# Patient Record
Sex: Female | Born: 1990 | Race: White | Hispanic: No | Marital: Single | Smoking: Current every day smoker
Health system: Southern US, Community
[De-identification: ages and names within clinical notes are randomized; demographics above are authoritative.]

## PROBLEM LIST (undated history)

## (undated) DIAGNOSIS — F32A Depression, unspecified: Secondary | ICD-10-CM

## (undated) DIAGNOSIS — F419 Anxiety disorder, unspecified: Secondary | ICD-10-CM

## (undated) DIAGNOSIS — F909 Attention-deficit hyperactivity disorder, unspecified type: Secondary | ICD-10-CM

## (undated) DIAGNOSIS — I10 Essential (primary) hypertension: Secondary | ICD-10-CM

## (undated) DIAGNOSIS — F329 Major depressive disorder, single episode, unspecified: Secondary | ICD-10-CM

## (undated) HISTORY — DX: Essential (primary) hypertension: I10

## (undated) HISTORY — DX: Depression, unspecified: F32.A

## (undated) HISTORY — DX: Attention-deficit hyperactivity disorder, unspecified type: F90.9

## (undated) HISTORY — DX: Anxiety disorder, unspecified: F41.9

## (undated) HISTORY — PX: WISDOM TOOTH EXTRACTION: SHX21

---

## 2014-03-24 ENCOUNTER — Encounter (HOSPITAL_COMMUNITY): Payer: Self-pay | Admitting: Emergency Medicine

## 2014-03-24 ENCOUNTER — Emergency Department (HOSPITAL_COMMUNITY)
Admission: EM | Admit: 2014-03-24 | Discharge: 2014-03-24 | Disposition: A | Discharge status: Home / Self Care | Payer: BLUE CROSS/BLUE SHIELD | Attending: Emergency Medicine | Admitting: Emergency Medicine

## 2014-03-24 ENCOUNTER — Emergency Department (HOSPITAL_COMMUNITY): Payer: BLUE CROSS/BLUE SHIELD

## 2014-03-24 DIAGNOSIS — R000 Tachycardia, unspecified: Secondary | ICD-10-CM | POA: Insufficient documentation

## 2014-03-24 DIAGNOSIS — F419 Anxiety disorder, unspecified: Secondary | ICD-10-CM | POA: Insufficient documentation

## 2014-03-24 DIAGNOSIS — Z720 Tobacco use: Secondary | ICD-10-CM | POA: Insufficient documentation

## 2014-03-24 DIAGNOSIS — F329 Major depressive disorder, single episode, unspecified: Secondary | ICD-10-CM | POA: Insufficient documentation

## 2014-03-24 DIAGNOSIS — R1011 Right upper quadrant pain: Secondary | ICD-10-CM | POA: Insufficient documentation

## 2014-03-24 DIAGNOSIS — R079 Chest pain, unspecified: Secondary | ICD-10-CM

## 2014-03-24 DIAGNOSIS — F909 Attention-deficit hyperactivity disorder, unspecified type: Secondary | ICD-10-CM | POA: Diagnosis not present

## 2014-03-24 DIAGNOSIS — R0789 Other chest pain: Secondary | ICD-10-CM | POA: Diagnosis not present

## 2014-03-24 DIAGNOSIS — Z79899 Other long term (current) drug therapy: Secondary | ICD-10-CM | POA: Diagnosis not present

## 2014-03-24 HISTORY — DX: Attention-deficit hyperactivity disorder, unspecified type: F90.9

## 2014-03-24 HISTORY — DX: Anxiety disorder, unspecified: F41.9

## 2014-03-24 HISTORY — DX: Depression, unspecified: F32.A

## 2014-03-24 HISTORY — DX: Major depressive disorder, single episode, unspecified: F32.9

## 2014-03-24 LAB — HEPATIC FUNCTION PANEL
ALK PHOS: 60 U/L (ref 39–117)
ALT: 12 U/L (ref 0–35)
AST: 24 U/L (ref 0–37)
Albumin: 4.5 g/dL (ref 3.5–5.2)
Bilirubin, Direct: <0.1 mg/dL (ref 0.0–0.3)
TOTAL PROTEIN: 7.7 g/dL (ref 6.0–8.3)
Total Bilirubin: 0.4 mg/dL (ref 0.3–1.2)

## 2014-03-24 LAB — BASIC METABOLIC PANEL
ANION GAP: 10 (ref 5–15)
BUN: 11 mg/dL (ref 6–23)
CHLORIDE: 108 meq/L (ref 96–112)
CO2: 22 mmol/L (ref 19–32)
CREATININE: 0.78 mg/dL (ref 0.50–1.10)
Calcium: 9.3 mg/dL (ref 8.4–10.5)
GFR calc Af Amer: >90 mL/min (ref >90)
GFR calc non Af Amer: >90 mL/min (ref >90)
GLUCOSE: 93 mg/dL (ref 70–99)
Potassium: 3.5 mmol/L (ref 3.5–5.1)
Sodium: 140 mmol/L (ref 135–145)

## 2014-03-24 LAB — I-STAT TROPONIN, ED: Troponin i, poc: 0 ng/mL (ref 0.00–0.08)

## 2014-03-24 LAB — I-STAT BETA HCG BLOOD, ED (MC, WL, AP ONLY): I-stat hCG, quantitative: <5 m[IU]/mL (ref <5)

## 2014-03-24 LAB — CBC
HCT: 41.9 % (ref 36.0–46.0)
Hemoglobin: 14.6 g/dL (ref 12.0–15.0)
MCH: 31.1 pg (ref 26.0–34.0)
MCHC: 34.8 g/dL (ref 30.0–36.0)
MCV: 89.1 fL (ref 78.0–100.0)
PLATELETS: 216 10*3/uL (ref 150–400)
RBC: 4.7 MIL/uL (ref 3.87–5.11)
RDW: 12.2 % (ref 11.5–15.5)
WBC: 6.2 10*3/uL (ref 4.0–10.5)

## 2014-03-24 LAB — RAPID URINE DRUG SCREEN, HOSP PERFORMED
AMPHETAMINES: POSITIVE — AB
BARBITURATES: NOT DETECTED
BENZODIAZEPINES: NOT DETECTED
Cocaine: NOT DETECTED
OPIATES: POSITIVE — AB
Tetrahydrocannabinol: POSITIVE — AB

## 2014-03-24 LAB — D-DIMER, QUANTITATIVE: D-Dimer, Quant: 0.4 ug/mL-FEU (ref 0.00–0.48)

## 2014-03-24 MED ORDER — LORAZEPAM 2 MG/ML IJ SOLN
1.0000 mg | Freq: Once | INTRAMUSCULAR | Status: AC
  Administered 2014-03-24: 1 mg via INTRAVENOUS
  Filled 2014-03-24: qty 1

## 2014-03-24 MED ORDER — VALACYCLOVIR HCL 500 MG PO TABS: 1000.0000 mg | ORAL_TABLET | Freq: Once | ORAL | Status: DC

## 2014-03-24 MED ORDER — SODIUM CHLORIDE 0.9 % IV BOLUS (SEPSIS)
1000.0000 mL | Freq: Once | INTRAVENOUS | Status: AC
  Administered 2014-03-24: 1000 mL via INTRAVENOUS

## 2014-03-24 MED ORDER — TRAMADOL HCL 50 MG PO TABS: 50.0000 mg | ORAL_TABLET | Freq: Four times a day (QID) | ORAL | Status: DC | PRN

## 2014-03-24 MED ORDER — MORPHINE SULFATE 4 MG/ML IJ SOLN
4.0000 mg | Freq: Once | INTRAMUSCULAR | Status: AC
  Administered 2014-03-24: 4 mg via INTRAVENOUS
  Filled 2014-03-24: qty 1

## 2014-03-24 NOTE — Discharge Instructions (Signed)
Chest Pain (Nonspecific) °It is often hard to give a specific diagnosis for the cause of chest pain. There is always a chance that your pain could be related to something serious, such as a heart attack or a blood clot in the lungs. You need to follow up with your health care provider for further evaluation. °CAUSES  °· Heartburn. °· Pneumonia or bronchitis. °· Anxiety or stress. °· Inflammation around your heart (pericarditis) or lung (pleuritis or pleurisy). °· A blood clot in the lung. °· A collapsed lung (pneumothorax). It can develop suddenly on its own (spontaneous pneumothorax) or from trauma to the chest. °· Shingles infection (herpes zoster virus). °The chest wall is composed of bones, muscles, and cartilage. Any of these can be the source of the pain. °· The bones can be bruised by injury. °· The muscles or cartilage can be strained by coughing or overwork. °· The cartilage can be affected by inflammation and become sore (costochondritis). °DIAGNOSIS  °Lab tests or other studies may be needed to find the cause of your pain. Your health care provider may have you take a test called an ambulatory electrocardiogram (ECG). An ECG records your heartbeat patterns over a 24-hour period. You may also have other tests, such as: °· Transthoracic echocardiogram (TTE). During echocardiography, sound waves are used to evaluate how blood flows through your heart. °· Transesophageal echocardiogram (TEE). °· Cardiac monitoring. This allows your health care provider to monitor your heart rate and rhythm in real time. °· Holter monitor. This is a portable device that records your heartbeat and can help diagnose heart arrhythmias. It allows your health care provider to track your heart activity for several days, if needed. °· Stress tests by exercise or by giving medicine that makes the heart beat faster. °TREATMENT  °· Treatment depends on what may be causing your chest pain. Treatment may include: °¨ Acid blockers for  heartburn. °¨ Anti-inflammatory medicine. °¨ Pain medicine for inflammatory conditions. °¨ Antibiotics if an infection is present. °· You may be advised to change lifestyle habits. This includes stopping smoking and avoiding alcohol, caffeine, and chocolate. °· You may be advised to keep your head raised (elevated) when sleeping. This reduces the chance of acid going backward from your stomach into your esophagus. °Most of the time, nonspecific chest pain will improve within 2-3 days with rest and mild pain medicine.  °HOME CARE INSTRUCTIONS  °· If antibiotics were prescribed, take them as directed. Finish them even if you start to feel better. °· For the next few days, avoid physical activities that bring on chest pain. Continue physical activities as directed. °· Do not use any tobacco products, including cigarettes, chewing tobacco, or electronic cigarettes. °· Avoid drinking alcohol. °· Only take medicine as directed by your health care provider. °· Follow your health care provider's suggestions for further testing if your chest pain does not go away. °· Keep any follow-up appointments you made. If you do not go to an appointment, you could develop lasting (chronic) problems with pain. If there is any problem keeping an appointment, call to reschedule. °SEEK MEDICAL CARE IF:  °· Your chest pain does not go away, even after treatment. °· You have a rash with blisters on your chest. °· You have a fever. °SEEK IMMEDIATE MEDICAL CARE IF:  °· You have increased chest pain or pain that spreads to your arm, neck, jaw, back, or abdomen. °· You have shortness of breath. °· You have an increasing cough, or you cough   up blood. °· You have severe back or abdominal pain. °· You feel nauseous or vomit. °· You have severe weakness. °· You faint. °· You have chills. °This is an emergency. Do not wait to see if the pain will go away. Get medical help at once. Call your local emergency services (911 in [place] Do not drive  yourself to the hospital. °MAKE SURE YOU:  °· Understand these instructions. °· Will watch your condition. °· Will get help right away if you are not doing well or get worse. °Document Released: 12/01/2004 Document Revised: 02/26/2013 Document Reviewed: 09/27/2007 °ExitCare® Patient Information ©2015 [employer]. This information is not intended to replace advice given to you by your health care provider. Make sure you discuss any questions you have with your health care provider. ° ° °Emergency Department Resource Guide °1) Find a Doctor and Pay Out of Pocket °Although you won't have to find out who is covered by your insurance plan, it is a good idea to ask around and get recommendations. You will then need to call the office and see if the doctor you have chosen will accept you as a new patient and what types of options they offer for patients who are self-pay. Some doctors offer discounts or will set up payment plans for their patients who do not have insurance, but you will need to ask so you aren't surprised when you get to your appointment. ° °2) Contact Your Local Health Department °Not all health departments have doctors that can see patients for sick visits, but many do, so it is worth a call to see if yours does. If you don't know where your local health department is, you can check in your phone book. The CDC also has a tool to help you locate your state's health department, and many state websites also have listings of all of their local health departments. ° °3) Find a Walk-in Clinic °If your illness is not likely to be very severe or complicated, you may want to try a walk in clinic. These are popping up all over the country in pharmacies, drugstores, and shopping centers. They're usually staffed by nurse practitioners or physician assistants that have been trained to treat common illnesses and complaints. They're usually fairly quick and inexpensive. However, if you have serious medical issues or  chronic medical problems, these are probably not your best option. ° °No Primary Care Doctor: °- Call [facility] at  832-8000 - they can help you locate a primary care doctor that  accepts your insurance, provides certain services, etc. °- Physician Referral Service- 1-800-533-3463 ° °Chronic Pain Problems: °Organization         Address  Phone   Notes  °[facility]  (336) 297-2271 Patients need to be referred by their primary care doctor.  ° °Medication Assistance: °Organization         Address  Phone   Notes  °[place] [facility] [address] °[place], [place] [address] (336) 641-8030 --Must be a resident of [place] °-- Must have NO insurance coverage whatsoever (no Medicaid/ Medicare, etc.) °-- The pt. MUST have a primary care doctor that directs their care regularly and follows them in the community °  °[facility]  (866) 331-1348   °[employer]  (888) 892-1162   ° °Agencies that provide inexpensive medical care: °Organization         Address  Phone   Notes  °[facility]  (  336) 832-8035   °[facility]    (336) 832-7272   °[facility] [address] °[place], [place] [address] (336) 832-4777   °[facility] [address], °[place] (336) 271-4999   °[facility]    (336) 373-0678   °[facility]    (336) 272-1050   °[facility] ° [address], [place] Phone:  (336) 832-4444, Fax:  (336) 832-4440 Hours of Operation:  9 am - 6 pm, M-F.  Also accepts Medicaid/Medicare and self-pay.  °[facility] ° [address], [place] Phone: (336) 832-3150, Fax: (336) [phone]. Hours of Operation:  8:30 am - 5:30 pm, M-F.  Also accepts Medicaid and self-pay.  °[facility] [place] [address], [place] Phone: (336) 878-6027   °[facility] [address], [place], [place]  (336)723-1848, Ext. 123 Mondays & Thursdays: 7-9 AM.  First 15 patients are seen on a first come, first serve basis. °  ° °Medicaid-accepting [place] Providers: ° °Organization         Address  Phone   Notes  °[facility] [address], [place] (336) 641-2100 Also accepts self-pay patients.  °[facility] [address], [place] ° (336) 856-9996   °[facility] [address], [place] (336) 288-8857   °[facility] [address] [place] [address], [place] (336) 299-7000   °Veita Bland [address], [place]  ° (336) 373-1557 Only accepts [place] Access Medicaid patients after they have their name applied to their card.  ° °Self-Pay (no insurance) in [place]: ° °Organization         Address  Phone   Notes  °Sickle Cell Patients, [facility] [address], [place] (336) 832-1970   °[facility] [address], [place] (336) 832-4400   °[facility] ° [address],  (336) 992-4800   °[facility]/Dr. Osei-Bonsu ° [address] [place] [address], [place] or [address], [place] (336) 841-8500 Phone number for both [place] and [place] locations is the same.  °[facility] [address], [place] (336) 299-0000   °[facility] [address] [place] [address], [place] or [address] (336) 852-7530 °(336) 878-2260   °[facility] [address], [place] (336) 350-1642, phone; (336) 294-5005, fax Sees patients 1st and 3rd Saturday of every month.  Must not qualify for public or private insurance (i.e. Medicaid, Medicare, [place] Health Choice, Veterans' Benefits) • Household income should be no more than 200% of the poverty level •The clinic cannot treat you if you are pregnant or think you are pregnant • Sexually transmitted  diseases are not treated at the clinic.  ° ° °Dental Care: °Organization         Address  Phone  Notes  °[facility] [address],  (336) 641-6152 Accepts children up to age 21 who are enrolled in Medicaid or [place] Health Choice; pregnant women with a Medicaid card; and children who have applied for Medicaid or  Health Choice, but were declined, whose parents can pay a reduced fee at time of service.  °[facility] [place]    [address], [place] (336) 641-7733 Accepts children up to age 21 who are enrolled in Medicaid or [place] Health Choice; pregnant women with a Medicaid card; and children who have applied for Medicaid or [place] Health Choice, but were declined, whose parents can pay a reduced fee at time of service.  °[facility] ° [address], [place] (336) 641-4533 Patients are seen by appointment only. Walk-ins are not accepted. [facility] will see patients 18 years of age and older. °Monday - Tuesday (8am-5pm) °Most Wednesdays (8:30-5pm) °$30 per visit, cash only  °[facility] ° [address], [place] (336) 641-4533 Patients are seen by appointment only. Walk-ins are not accepted. [facility] will see patients 18 years of age and older. °One Wednesday Evening (Monthly: Volunteer Based).  $30 per visit, cash only  °[facility]  (919) 537-3737 for adults; Children under age 4, call Graduate Pediatric Dentistry at ([phone]) [phone]. Children aged 4-14, please call ([phone]) [phone] to request a pediatric application. ° Dental services are provided in all areas of dental care including fillings, crowns and bridges, complete and partial dentures, implants, gum treatment, root canals, and extractions. Preventive care is also provided. Treatment is provided to both adults and children. °Patients are selected via a  lottery and there is often a waiting list. °  °[facility] [address], °[place] ° (336) 763-8833 [email or web address] °  °[facility] [address], [place], [place] (336)723-1848, Ext. 123 Second and Fourth Thursday of each month, opens at 6:30 AM; Clinic ends at 9 AM.  Patients are seen on a first-come first-served basis, and a limited number are seen during each clinic.  ° °[facility] ° [address], [place], [place] (336) 723-7904   Eligibility Requirements °You must have lived in [place], [place], or [place] counties for at least the last three months. °  You cannot be eligible for state or federal sponsored healthcare insurance, including Veterans Administration, Medicaid, or Medicare. °  You generally cannot be eligible for healthcare insurance through your employer.  °  How to apply: °Eligibility screenings are held every Tuesday and Wednesday afternoon from 1:00 pm until 4:00 pm. You do not need an appointment for the interview!  °[facility] [address], [place], [place] 336-631-2330   °[facility]  336-342-8273   °[facility]  336-703-3100   °[facility]  336-570-6415   ° °Behavioral Health Resources in the Community: °Intensive Outpatient Programs °Organization         Address  Phone  Notes  °[facility] [address], [place], [place] 336-878-6098   °[facility] [address], [place], [place] 336-832-9800   °[facility] [address], [place], [place] ° 336-882-2125   °[facility] [address],  °[place], [place] 1-800-853-5163 or 336-641-4981   °Substance Abuse Resources °Organization         Address  Phone  Notes  °[facility]  336-882-2125   °[facility]  336-784-9470   °[facility]  336-285-9073   °[facility]  336-845-3988   °Residential &  Outpatient Substance Abuse Program  1-800-659-3381   °Psychological Services °Organization         Address  Phone  Notes  °[facility]  336- 832-9600   °  [facility]  336- 378-7881   °[facility] [address], [place] 1-800-853-5163 or 336-641-4981   ° °Mobile Crisis Teams °Organization         Address  Phone  Notes  °[facility], Mobile Crisis Care Unit  1-877-626-1772   °Assertive °[facility] ° [address] [place], [place] 336-834-9664   °Sharon DeEsch [address] °[place] 336-554-5454   ° °Self-Help/Support Groups °Organization         Address  Phone             Notes  °[facility] - variety of support groups  336- 373-1402 Call for more information  °Narcotics Anonymous (NA), [facility] [address], °[place]  2 meetings at this location  ° °Residential Treatment Programs °Organization         Address  Phone  Notes  °[facility] [address],    °[place]  1-866-801-8205   °[facility] ° [address], [place], [place] 704-293-8524   °[facility] [address], [place] 336-845-3988 Admissions: 8am-3pm M-F  °[facility] [address]    °[place], [place] 336-841-1104   °[facility] [address], [place], [place] 336-379-7146   °[facility] [address]  °[place], [place] 336-285-9073   °[facility] Intensive Outpatient [address], [place], [place] 336-852-3033   °[facility] [address]  °[place], [place] 1-877-615-2722 or 336-784-9470   °[facility] ([facility] [address] [place], [place] 336-227-7417 Accepts Medicaid  °[facility] [address]  °[place] 1-800-659-3381 Substance Abuse/Addiction Treatment  ° °[place] Behavioral Health Resources °Organization          Address  Phone  Notes  °[facility]  (888) 581-9988   °Julie Brannon, PhD [address] [place], [place]   (336) 349-5553 or (336) 951-0000   ° Behavioral   [address] °[place], [place] (336) 349-4454   °[facility] [address], [place], [place] (336) 342-8316 Insurance/Medicaid/sponsorship through [facility]  °[facility] [address]                                    [place], [place] (336) 342-8316 Therapy/tele-psych/case  °[facility] [address]  ° [place], [place] (336) 349-2233    °Dr. Arfeen  (336) 349-4544   °[facility] [place]  [employer] [place] [facility] 1) [address], [place] °2) [address], [place] °3)  [address], [place] (336) 349-3220 °(336) 342-7768 ° °(336) 342-8140   °[place] [facility] (336) 342-1394 or (336) 342-3537 (After Hours)    ° ° ° °

## 2014-03-24 NOTE — ED Notes (Signed)
PA student at bedside, unable to obtain blood at this time.

## 2014-03-24 NOTE — ED Notes (Signed)
Pt c/o sharp pain in epigastric area x couple years, pt states it has gotten to a point where its an everyday problem. Pt denies n/v or SOB.

## 2014-03-24 NOTE — ED Notes (Signed)
PA at bedside, unable to obtain blood labs at this time.

## 2014-03-24 NOTE — ED Provider Notes (Signed)
2211 - [facility] at shift change. Plan discussed with Pisciotta, PA-C which includes discharging ultrasound negative. Ultrasound reviewed which is negative for any acute findings. No evidence of cholelithiasis or cholecystitis. UDS also reviewed. Suspect that positive amphetamines is secondary to prescribed Adderall. Patient does endorse recreational marijuana use. Opiates on UDS likely secondary to morphine given in ED. I have reviewed the findings with the patient in their entirety. She expresses frustration with the inability to find what is wrong with her causing her pain. I have reassured the patient that it does not appear to be emergent in nature and that she may safely follow up with a primary care provider on an outpatient basis. Patient given resource guide as well as return precautions. Patient agreeable to plan with no unaddressed concerns. Patient discharged in good condition; VSS.  Results for orders placed or performed during the hospital encounter of 03/24/14  CBC  Result Value Ref Range   WBC 6.2 4.0 - 10.5 K/uL   RBC 4.70 3.87 - 5.11 MIL/uL   Hemoglobin 14.6 12.0 - 15.0 g/dL   HCT 16.141.9 09.636.0 - 04.546.0 %   MCV 89.1 78.0 - 100.0 fL   MCH 31.1 26.0 - 34.0 pg   MCHC 34.8 30.0 - 36.0 g/dL   RDW 40.912.2 81.111.5 - 91.415.5 %   Platelets 216 150 - 400 K/uL  Basic metabolic panel  Result Value Ref Range   Sodium 140 135 - 145 mmol/L   Potassium 3.5 3.5 - 5.1 mmol/L   Chloride 108 96 - 112 mEq/L   CO2 22 19 - 32 mmol/L   Glucose, Bld 93 70 - 99 mg/dL   BUN 11 6 - 23 mg/dL   Creatinine, Ser 7.820.78 0.50 - 1.10 mg/dL   Calcium 9.3 8.4 - 95.610.5 mg/dL   GFR calc non Af Amer >90 >90 mL/min   GFR calc Af Amer >90 >90 mL/min   Anion gap 10 5 - 15  D-dimer, quantitative  Result Value Ref Range   D-Dimer, Quant 0.40 0.00 - 0.48 ug/mL-FEU  Hepatic function panel  Result Value Ref Range   Total Protein 7.7 6.0 - 8.3 g/dL   Albumin 4.5 3.5 - 5.2 g/dL   AST 24 0 - 37  U/L   ALT 12 0 - 35 U/L   Alkaline Phosphatase 60 39 - 117 U/L   Total Bilirubin 0.4 0.3 - 1.2 mg/dL   Bilirubin, Direct <2.1<0.1 0.0 - 0.3 mg/dL   Indirect Bilirubin NOT CALCULATED 0.3 - 0.9 mg/dL  Drug screen panel, emergency  Result Value Ref Range   Opiates POSITIVE (A) NONE DETECTED   Cocaine NONE DETECTED NONE DETECTED   Benzodiazepines NONE DETECTED NONE DETECTED   Amphetamines POSITIVE (A) NONE DETECTED   Tetrahydrocannabinol POSITIVE (A) NONE DETECTED   Barbiturates NONE DETECTED NONE DETECTED  I-stat troponin, ED ([facility])  Result Value Ref Range   Troponin i, poc 0.00 0.00 - 0.08 ng/mL   Comment 3          I-Stat Beta hCG blood, ED ([facility], [facility], [facility] only)  Result Value Ref Range   I-stat hCG, quantitative <5.0 <5 mIU/mL   Comment 3           Dg Chest 2 View  03/24/2014   CLINICAL DATA:  Sharp pain in the epigastric area for a couple years. Has gotten worse.  EXAM: CHEST  2 VIEW  COMPARISON:  None.  FINDINGS: The heart size and  mediastinal contours are within normal limits. Both lungs are clear. The visualized skeletal structures are unremarkable.  IMPRESSION: No active cardiopulmonary disease.   Electronically Signed   By: Elige Ko   On: 03/24/2014 18:30   US Abdomen Complete  03/24/2014   CLINICAL DATA:  Right upper quadrant pain for 2 years, now worse.  EXAM: ULTRASOUND ABDOMEN COMPLETE  COMPARISON:  None.  FINDINGS: Gallbladder: No gallstones or wall thickening visualized. No sonographic Murphy sign noted.  Common bile duct: Diameter: 3.5 mm  Liver: No focal lesion identified. Within normal limits in parenchymal echogenicity.  IVC: No abnormality visualized.  Pancreas: Visualized portion unremarkable.  Spleen: Size and appearance within normal limits.  Right Kidney: Length: 9.9 cm. Echogenicity within normal limits. No mass or hydronephrosis visualized.  Left Kidney: Length: 10.5 cm. Echogenicity within normal limits. No mass or hydronephrosis visualized.  Abdominal aorta: No  aneurysm visualized.  Other findings: None.  IMPRESSION: Normal abdominal ultrasound.   Electronically Signed   By: Amie Portland M.D.   On: 03/24/2014 20:41      Antony Madura, PA-C 03/25/14 1478  Flint Melter, MD 03/25/14 [phone]

## 2014-03-24 NOTE — ED Provider Notes (Signed)
CSN: [id number]     Arrival date & time 03/24/14  1710 History   First MD Initiated Contact with Patient 03/24/14 1737     Chief Complaint  Patient presents with  . Chest Pain     (Consider location/radiation/quality/duration/timing/severity/associated sxs/prior Treatment) HPI  Sharion Settlerlla Cuadrado is a 24 y.o. female complaining of presents to ED today c/o R rib pain for "several years." She states she has been ignoring this pain and putting off having it been evaluated. She states it worsened on Saturday AM during a coughing fit from what she describes as a resolving cold. Since that time she has had R rib/RUQ pain that is worsened by movement, cough, and palpation. The pain is relieved with position and rest. She describes the pain as sharp and stabbing. The pain does radiate and is 10/10 at worst. The pain is not associated with eating. She denies NVD, DOE, leg swelling/pain, and CP. She has no recent extended travel, but does endorse OCPs and cigarette smoking.  Past Medical History  Diagnosis Date  . Anxiety   . ADHD (attention deficit hyperactivity disorder)   . Depression    Past Surgical History  Procedure Laterality Date  . Wisdom tooth extraction     No family history on file. History  Substance Use Topics  . Smoking status: Current Every Day Smoker  . Smokeless tobacco: Not on file  . Alcohol Use: Yes   OB History    No data available     Review of Systems  10 systems reviewed and found to be negative, except as noted in the HPI.  Allergies  Review of patient's allergies indicates no known allergies.  Home Medications   Prior to Admission medications   Medication Sig Start Date End Date Taking? Authorizing Provider  amphetamine-dextroamphetamine (ADDERALL XR) 20 MG 24 hr capsule Take 1 capsule by mouth 2 (two) times daily. 03/12/14  Yes Historical Provider, MD  APRI 0.15-30 MG-MCG tablet Take 1 tablet by mouth daily. 03/17/14  Yes Historical Provider, MD  busPIRone  (BUSPAR) 10 MG tablet Take 1 tablet by mouth 3 (three) times daily as needed. anxiety 03/12/14  Yes Historical Provider, MD  clonazePAM (KLONOPIN) 0.5 MG tablet Take 1 tablet by mouth 2 (two) times daily as needed. anxiety 03/12/14  Yes Historical Provider, MD  hydrOXYzine (VISTARIL) 25 MG capsule Take 1 capsule by mouth at bedtime as needed. sleep 03/12/14  Yes Historical Provider, MD  venlafaxine XR (EFFEXOR-XR) 75 MG 24 hr capsule Take 1 capsule by mouth 2 (two) times daily. 03/12/14  Yes Historical Provider, MD   BP 132/94 mmHg  Pulse 142  Temp(Src) 98.3 F (36.8 C) (Oral)  Resp 22  SpO2 100%  LMP 03/03/2014 (Exact Date) Physical Exam  Constitutional: She is oriented to person, place, and time. She appears well-developed and well-nourished. No distress.  HENT:  Head: Normocephalic.  Mouth/Throat: Oropharynx is clear and moist.  Eyes: Conjunctivae and EOM are normal. Pupils are equal, round, and reactive to light.  Neck: Normal range of motion. Neck supple.  Cardiovascular: Regular rhythm and intact distal pulses.   Tachycardic  Pulmonary/Chest: Effort normal and breath sounds normal. No stridor. No respiratory distress. She has no wheezes. She has no rales. She exhibits tenderness.  Patient starts crying and splints before I am able to palpate her abdomen or chest. Ports exquisite tenderness to palpation of left right anterior lower chest and right upper quadrant.  Abdominal: Bowel sounds are normal. She exhibits no distension and no  mass. There is tenderness. There is no rebound and no guarding.  Musculoskeletal: Normal range of motion. She exhibits no edema or tenderness.  No calf asymmetry, superficial collaterals, palpable cords, edema, Homans sign negative bilaterally.    Neurological: She is alert and oriented to person, place, and time.  Psychiatric: She has a normal mood and affect.  Tearful, anxious.  Nursing note and vitals reviewed.   ED Course  Procedures (including critical  care time) Labs Review Labs Reviewed  CBC  BASIC METABOLIC PANEL  D-DIMER, QUANTITATIVE  HEPATIC FUNCTION PANEL  URINE RAPID DRUG SCREEN (HOSP PERFORMED)  I-STAT TROPOININ, ED  I-STAT BETA HCG BLOOD, ED ([facility], [facility], [facility] ONLY)    Imaging Review No results found.   EKG Interpretation None      MDM   Final diagnoses:  Left sided chest pain  RUQ abdominal pain    Filed Vitals:   03/24/14 1856 03/24/14 1857 03/24/14 1858 03/24/14 1859  BP:      Pulse: 112 111 110 115  Temp:      TempSrc:      Resp: SpO2: 100% 100% 100% 100%    Medications  sodium chloride 0.9 % bolus 1,000 mL (1,000 mLs Intravenous New Bag/Given 03/24/14 1840)  morphine 4 MG/ML injection 4 mg (4 mg Intravenous Given 03/24/14 1840)  LORazepam (ATIVAN) injection 1 mg (1 mg Intravenous Given 03/24/14 1841)    Amiley Shishido is a pleasant 24 y.o. female presenting with exacerbation of patient's chronic right upper quadrant pain, states that this pain is more severe than normal. Patient is extraordinarily anxious, tearful. She has good insight and knows that she is having an anxiety attack. Physical exam is not consistent with DVT, however patient does take hormonal birth control so d-dimer is ordered. EKG so sinus tachycardia, chest x-rays without infiltrate, no anemia or other blood abnormality.  Patient's d-dimer is negative, will obtain abdominal ultrasound to further investigate the cause of patient's chronic pain. I've advised the patient of her normal results, I have reassured her and after Ativan she is much calmer and her tachycardia has improved but not completely resolved. Patient will be given fluid bolus. Case signed out to PA [facility] at shift change: Plan is to follow-up ultrasound and reassess.    Wynetta Emery, PA-C 03/24/14 2025  Flint Melter, MD 03/25/14 0000

## 2014-06-08 ENCOUNTER — Emergency Department (HOSPITAL_COMMUNITY)
Admission: EM | Admit: 2014-06-08 | Discharge: 2014-06-08 | Disposition: A | Discharge status: Home / Self Care | Payer: BLUE CROSS/BLUE SHIELD | Attending: Emergency Medicine | Admitting: Emergency Medicine

## 2014-06-08 ENCOUNTER — Encounter (HOSPITAL_COMMUNITY): Payer: Self-pay | Admitting: *Deleted

## 2014-06-08 ENCOUNTER — Inpatient Hospital Stay (HOSPITAL_COMMUNITY)
Admission: AD | Admit: 2014-06-08 | Discharge: 2014-06-13 | DRG: 897 | Disposition: A | Discharge status: Home / Self Care | Payer: BLUE CROSS/BLUE SHIELD | Source: Intra-hospital | Attending: Psychiatry | Admitting: Psychiatry

## 2014-06-08 ENCOUNTER — Encounter (HOSPITAL_COMMUNITY): Payer: Self-pay | Admitting: Emergency Medicine

## 2014-06-08 DIAGNOSIS — F329 Major depressive disorder, single episode, unspecified: Secondary | ICD-10-CM | POA: Insufficient documentation

## 2014-06-08 DIAGNOSIS — J029 Acute pharyngitis, unspecified: Secondary | ICD-10-CM | POA: Diagnosis present

## 2014-06-08 DIAGNOSIS — F410 Panic disorder [episodic paroxysmal anxiety] without agoraphobia: Secondary | ICD-10-CM | POA: Diagnosis present

## 2014-06-08 DIAGNOSIS — R45851 Suicidal ideations: Secondary | ICD-10-CM | POA: Diagnosis present

## 2014-06-08 DIAGNOSIS — F1920 Other psychoactive substance dependence, uncomplicated: Secondary | ICD-10-CM

## 2014-06-08 DIAGNOSIS — F419 Anxiety disorder, unspecified: Secondary | ICD-10-CM | POA: Insufficient documentation

## 2014-06-08 DIAGNOSIS — Z3202 Encounter for pregnancy test, result negative: Secondary | ICD-10-CM | POA: Insufficient documentation

## 2014-06-08 DIAGNOSIS — F909 Attention-deficit hyperactivity disorder, unspecified type: Secondary | ICD-10-CM | POA: Diagnosis present

## 2014-06-08 DIAGNOSIS — F17210 Nicotine dependence, cigarettes, uncomplicated: Secondary | ICD-10-CM | POA: Diagnosis present

## 2014-06-08 DIAGNOSIS — B9689 Other specified bacterial agents as the cause of diseases classified elsewhere: Secondary | ICD-10-CM

## 2014-06-08 DIAGNOSIS — F1994 Other psychoactive substance use, unspecified with psychoactive substance-induced mood disorder: Secondary | ICD-10-CM | POA: Diagnosis not present

## 2014-06-08 DIAGNOSIS — Z720 Tobacco use: Secondary | ICD-10-CM | POA: Diagnosis not present

## 2014-06-08 DIAGNOSIS — J208 Acute bronchitis due to other specified organisms: Secondary | ICD-10-CM

## 2014-06-08 DIAGNOSIS — Z79899 Other long term (current) drug therapy: Secondary | ICD-10-CM | POA: Diagnosis not present

## 2014-06-08 DIAGNOSIS — F32A Depression, unspecified: Secondary | ICD-10-CM

## 2014-06-08 DIAGNOSIS — F322 Major depressive disorder, single episode, severe without psychotic features: Secondary | ICD-10-CM | POA: Diagnosis not present

## 2014-06-08 LAB — CBC WITH DIFFERENTIAL/PLATELET
BASOS PCT: 0 % (ref 0–1)
Basophils Absolute: 0 10*3/uL (ref 0.0–0.1)
EOS PCT: 0 % (ref 0–5)
Eosinophils Absolute: 0.1 10*3/uL (ref 0.0–0.7)
HCT: 35.8 % — ABNORMAL LOW (ref 36.0–46.0)
Hemoglobin: 11.6 g/dL — ABNORMAL LOW (ref 12.0–15.0)
Lymphocytes Relative: 8 % — ABNORMAL LOW (ref 12–46)
Lymphs Abs: 1.7 10*3/uL (ref 0.7–4.0)
MCH: 29.9 pg (ref 26.0–34.0)
MCHC: 32.4 g/dL (ref 30.0–36.0)
MCV: 92.3 fL (ref 78.0–100.0)
MONO ABS: 2.5 10*3/uL — AB (ref 0.1–1.0)
Monocytes Relative: 12 % (ref 3–12)
Neutro Abs: 16.3 10*3/uL — ABNORMAL HIGH (ref 1.7–7.7)
Neutrophils Relative %: 80 % — ABNORMAL HIGH (ref 43–77)
PLATELETS: 241 10*3/uL (ref 150–400)
RBC: 3.88 MIL/uL (ref 3.87–5.11)
RDW: 13.1 % (ref 11.5–15.5)
WBC: 20.5 10*3/uL — ABNORMAL HIGH (ref 4.0–10.5)

## 2014-06-08 LAB — COMPREHENSIVE METABOLIC PANEL
ALK PHOS: 53 U/L (ref 39–117)
ALT: 13 U/L (ref 0–35)
AST: 21 U/L (ref 0–37)
Albumin: 4 g/dL (ref 3.5–5.2)
Anion gap: 10 (ref 5–15)
BILIRUBIN TOTAL: 0.5 mg/dL (ref 0.3–1.2)
BUN: 9 mg/dL (ref 6–23)
CALCIUM: 8.8 mg/dL (ref 8.4–10.5)
CO2: 20 mmol/L (ref 19–32)
Chloride: 106 mmol/L (ref 96–112)
Creatinine, Ser: 0.68 mg/dL (ref 0.50–1.10)
GFR calc Af Amer: >90 mL/min (ref >90)
GFR calc non Af Amer: >90 mL/min (ref >90)
Glucose, Bld: 103 mg/dL — ABNORMAL HIGH (ref 70–99)
Potassium: 3.8 mmol/L (ref 3.5–5.1)
SODIUM: 136 mmol/L (ref 135–145)
Total Protein: 7 g/dL (ref 6.0–8.3)

## 2014-06-08 LAB — RAPID URINE DRUG SCREEN, HOSP PERFORMED
AMPHETAMINES: NOT DETECTED
BARBITURATES: NOT DETECTED
Benzodiazepines: NOT DETECTED
Cocaine: NOT DETECTED
Opiates: POSITIVE — AB
TETRAHYDROCANNABINOL: POSITIVE — AB

## 2014-06-08 LAB — ETHANOL: Alcohol, Ethyl (B): <5 mg/dL (ref 0–9)

## 2014-06-08 LAB — POC URINE PREG, ED: Preg Test, Ur: NEGATIVE

## 2014-06-08 LAB — MONONUCLEOSIS SCREEN: MONO SCREEN: NEGATIVE

## 2014-06-08 MED ORDER — MORPHINE SULFATE 4 MG/ML IJ SOLN
4.0000 mg | Freq: Once | INTRAMUSCULAR | Status: AC
  Administered 2014-06-08: 4 mg via INTRAVENOUS
  Filled 2014-06-08: qty 1

## 2014-06-08 MED ORDER — ACETAMINOPHEN 325 MG PO TABS
650.0000 mg | ORAL_TABLET | Freq: Four times a day (QID) | ORAL | Status: DC | PRN
  Administered 2014-06-08: 650 mg via ORAL
  Filled 2014-06-08: qty 2

## 2014-06-08 MED ORDER — SODIUM CHLORIDE 0.9 % IV BOLUS (SEPSIS)
1000.0000 mL | Freq: Once | INTRAVENOUS | Status: AC
  Administered 2014-06-08: 1000 mL via INTRAVENOUS

## 2014-06-08 MED ORDER — ALUM & MAG HYDROXIDE-SIMETH 200-200-20 MG/5ML PO SUSP: 30.0000 mL | ORAL | Status: DC | PRN

## 2014-06-08 MED ORDER — ADULT MULTIVITAMIN W/MINERALS CH
1.0000 | ORAL_TABLET | Freq: Every day | ORAL | Status: DC
  Administered 2014-06-08 – 2014-06-13 (×6): 1 via ORAL
  Filled 2014-06-08: qty 1
  Filled 2014-06-08: qty 3
  Filled 2014-06-08 (×8): qty 1

## 2014-06-08 MED ORDER — CHLORDIAZEPOXIDE HCL 25 MG PO CAPS
25.0000 mg | ORAL_CAPSULE | Freq: Three times a day (TID) | ORAL | Status: AC
  Administered 2014-06-10 (×2): 25 mg via ORAL
  Filled 2014-06-08 (×3): qty 1

## 2014-06-08 MED ORDER — BUSPIRONE HCL 10 MG PO TABS: 10.0000 mg | ORAL_TABLET | Freq: Three times a day (TID) | ORAL | Status: DC | PRN

## 2014-06-08 MED ORDER — CHLORDIAZEPOXIDE HCL 25 MG PO CAPS
25.0000 mg | ORAL_CAPSULE | Freq: Every day | ORAL | Status: AC
  Filled 2014-06-08 (×3): qty 1

## 2014-06-08 MED ORDER — DESOGESTREL-ETHINYL ESTRADIOL 0.15-30 MG-MCG PO TABS: 1.0000 | ORAL_TABLET | Freq: Every day | ORAL | Status: DC

## 2014-06-08 MED ORDER — THIAMINE HCL 100 MG/ML IJ SOLN: 100.0000 mg | Freq: Once | INTRAMUSCULAR | Status: DC

## 2014-06-08 MED ORDER — LOPERAMIDE HCL 2 MG PO CAPS: 2.0000 mg | ORAL_CAPSULE | ORAL | Status: AC | PRN

## 2014-06-08 MED ORDER — ACETAMINOPHEN 325 MG PO TABS: 650.0000 mg | ORAL_TABLET | ORAL | Status: DC | PRN

## 2014-06-08 MED ORDER — LORAZEPAM 1 MG PO TABS: 1.0000 mg | ORAL_TABLET | Freq: Three times a day (TID) | ORAL | Status: DC | PRN

## 2014-06-08 MED ORDER — CHLORDIAZEPOXIDE HCL 25 MG PO CAPS
25.0000 mg | ORAL_CAPSULE | Freq: Four times a day (QID) | ORAL | Status: AC | PRN
  Administered 2014-06-11: 25 mg via ORAL
  Filled 2014-06-08: qty 1

## 2014-06-08 MED ORDER — CLONIDINE HCL 0.1 MG PO TABS: 0.1000 mg | ORAL_TABLET | Freq: Every day | ORAL | Status: DC

## 2014-06-08 MED ORDER — MAGNESIUM HYDROXIDE 400 MG/5ML PO SUSP: 30.0000 mL | Freq: Every day | ORAL | Status: DC | PRN

## 2014-06-08 MED ORDER — LIDOCAINE VISCOUS 2 % MT SOLN
20.0000 mL | OROMUCOSAL | Status: DC | PRN
  Filled 2014-06-08: qty 30

## 2014-06-08 MED ORDER — METHOCARBAMOL 500 MG PO TABS
500.0000 mg | ORAL_TABLET | Freq: Three times a day (TID) | ORAL | Status: DC | PRN
Start: 2014-06-08 — End: 2014-06-13
  Administered 2014-06-11: 500 mg via ORAL
  Filled 2014-06-08: qty 1

## 2014-06-08 MED ORDER — VENLAFAXINE HCL ER 75 MG PO CP24
75.0000 mg | ORAL_CAPSULE | Freq: Two times a day (BID) | ORAL | Status: DC
  Filled 2014-06-08: qty 1

## 2014-06-08 MED ORDER — MAGIC MOUTHWASH
10.0000 mL | Freq: Once | ORAL | Status: AC
  Administered 2014-06-08: 10 mL via ORAL
  Filled 2014-06-08: qty 10

## 2014-06-08 MED ORDER — TRAMADOL HCL 50 MG PO TABS: 50.0000 mg | ORAL_TABLET | Freq: Four times a day (QID) | ORAL | Status: DC | PRN

## 2014-06-08 MED ORDER — CLONIDINE HCL 0.1 MG PO TABS
0.1000 mg | ORAL_TABLET | Freq: Four times a day (QID) | ORAL | Status: DC
  Administered 2014-06-08 – 2014-06-09 (×3): 0.1 mg via ORAL
  Filled 2014-06-08 (×13): qty 1

## 2014-06-08 MED ORDER — KETOROLAC TROMETHAMINE 30 MG/ML IJ SOLN
30.0000 mg | Freq: Once | INTRAMUSCULAR | Status: AC
  Administered 2014-06-08: 30 mg via INTRAVENOUS
  Filled 2014-06-08: qty 1

## 2014-06-08 MED ORDER — DICYCLOMINE HCL 20 MG PO TABS: 20.0000 mg | ORAL_TABLET | Freq: Four times a day (QID) | ORAL | Status: DC | PRN

## 2014-06-08 MED ORDER — IBUPROFEN 200 MG PO TABS: 600.0000 mg | ORAL_TABLET | Freq: Three times a day (TID) | ORAL | Status: DC | PRN

## 2014-06-08 MED ORDER — NICOTINE 21 MG/24HR TD PT24: 21.0000 mg | MEDICATED_PATCH | Freq: Every day | TRANSDERMAL | Status: DC

## 2014-06-08 MED ORDER — CHLORDIAZEPOXIDE HCL 25 MG PO CAPS
25.0000 mg | ORAL_CAPSULE | ORAL | Status: AC
  Administered 2014-06-10 – 2014-06-11 (×2): 25 mg via ORAL
  Filled 2014-06-08: qty 1

## 2014-06-08 MED ORDER — AMPHETAMINE-DEXTROAMPHET ER 10 MG PO CP24: 20.0000 mg | ORAL_CAPSULE | Freq: Two times a day (BID) | ORAL | Status: DC

## 2014-06-08 MED ORDER — NAPROXEN 500 MG PO TABS
500.0000 mg | ORAL_TABLET | Freq: Two times a day (BID) | ORAL | Status: DC | PRN
  Administered 2014-06-09 – 2014-06-12 (×7): 500 mg via ORAL
  Filled 2014-06-08 (×8): qty 1

## 2014-06-08 MED ORDER — VITAMIN B-1 100 MG PO TABS
100.0000 mg | ORAL_TABLET | Freq: Every day | ORAL | Status: DC
  Administered 2014-06-09 – 2014-06-13 (×5): 100 mg via ORAL
  Filled 2014-06-08 (×8): qty 1

## 2014-06-08 MED ORDER — HYDROXYZINE HCL 25 MG PO TABS
25.0000 mg | ORAL_TABLET | Freq: Four times a day (QID) | ORAL | Status: DC | PRN
  Administered 2014-06-08 – 2014-06-09 (×2): 25 mg via ORAL
  Filled 2014-06-08 (×2): qty 1

## 2014-06-08 MED ORDER — ONDANSETRON 4 MG PO TBDP: 4.0000 mg | ORAL_TABLET | Freq: Four times a day (QID) | ORAL | Status: AC | PRN

## 2014-06-08 MED ORDER — CLONIDINE HCL 0.1 MG PO TABS
0.1000 mg | ORAL_TABLET | ORAL | Status: DC
  Filled 2014-06-08: qty 1

## 2014-06-08 MED ORDER — ONDANSETRON HCL 4 MG PO TABS: 4.0000 mg | ORAL_TABLET | Freq: Three times a day (TID) | ORAL | Status: DC | PRN

## 2014-06-08 MED ORDER — CHLORDIAZEPOXIDE HCL 25 MG PO CAPS
25.0000 mg | ORAL_CAPSULE | Freq: Four times a day (QID) | ORAL | Status: AC
  Administered 2014-06-08 – 2014-06-09 (×4): 25 mg via ORAL
  Filled 2014-06-08 (×4): qty 1

## 2014-06-08 NOTE — ED Notes (Addendum)
Pt from home c/o sore throat x 2 days. Pt reports that is difficult to eat or drink. Denies fever. She reports being seen at [facility] testing negative for strep.  Appears to be possible peristonsilar abcess. Right side of throat red and swollen however, not obstructing airway. Pt tearful during assessment.

## 2014-06-08 NOTE — ED Provider Notes (Signed)
CSN: [id number]     Arrival date & time 06/08/14  1050 History   First MD Initiated Contact with Patient 06/08/14 1110     Chief Complaint  Patient presents with  . Sore Throat     (Consider location/radiation/quality/duration/timing/severity/associated sxs/prior Treatment) HPI   24 year old female with history of anxiety and depression who presents complaining of sore throat. Patient developed gradual onset of throat irritation and tenderness to her throat ongoing for the past 3 days. She also endorses nasal congestion, occasional cough. She reported having difficulty eating and drinking due to the pain. Denies having any fever, chest pain, shortness of breath, abdominal pain, vomiting or diarrhea, or rash. Report history of strep throat in the past. States she was seen at [facility] yesterday for this complaint. States she had a negative strep test and was discharged with lidocaine mouth rinse and ibuprofen. States she has been taking the prescribed medication but symptoms got much worse. She is here requesting for further evaluation.  Past Medical History  Diagnosis Date  . Anxiety   . ADHD (attention deficit hyperactivity disorder)   . Depression    Past Surgical History  Procedure Laterality Date  . Wisdom tooth extraction     No family history on file. History  Substance Use Topics  . Smoking status: Current Every Day Smoker  . Smokeless tobacco: Not on file  . Alcohol Use: Yes   OB History    No data available     Review of Systems  All other systems reviewed and are negative.     Allergies  Lavender oil  Home Medications   Prior to Admission medications   Medication Sig Start Date End Date Taking? Authorizing Provider  amphetamine-dextroamphetamine (ADDERALL XR) 20 MG 24 hr capsule Take 1 capsule by mouth 2 (two) times daily. 03/12/14  Yes Historical Provider, MD  APRI 0.15-30 MG-MCG tablet Take 1 tablet by mouth daily. 03/17/14  Yes Historical Provider, MD   busPIRone (BUSPAR) 10 MG tablet Take 1 tablet by mouth 3 (three) times daily as needed. anxiety 03/12/14  Yes Historical Provider, MD  clonazePAM (KLONOPIN) 0.5 MG tablet Take 1 tablet by mouth 2 (two) times daily as needed. anxiety 03/12/14  Yes Historical Provider, MD  ibuprofen (ADVIL,MOTRIN) 200 MG tablet Take 400 mg by mouth every 6 (six) hours as needed for fever or moderate pain.   Yes Historical Provider, MD  lidocaine (XYLOCAINE) 2 % solution Use as directed 20 mLs in the mouth or throat every 4 (four) hours as needed for mouth pain.  06/07/14  Yes Historical Provider, MD  venlafaxine XR (EFFEXOR-XR) 75 MG 24 hr capsule Take 1 capsule by mouth 2 (two) times daily. 03/12/14  Yes Historical Provider, MD  traMADol (ULTRAM) 50 MG tablet Take 1 tablet (50 mg total) by mouth every 6 (six) hours as needed for severe pain. Patient not taking: Reported on 06/08/2014 03/24/14   Antony MaduraKelly Humes, PA-C   BP 113/80 mmHg  Pulse 104  Temp(Src) 98.4 F (36.9 C) (Oral)  Resp 18  SpO2 100% Physical Exam  Constitutional: She appears well-developed and well-nourished. No distress.  Caucasian female, nontoxic in appearance, is tearful.  HENT:  Head: Atraumatic.  Ears: Bilateral TMs without evidence of erythema or dullness   Nose: Normal nares  Throat: Uvula is midline, bilateral tonsillar enlargement with postnasal drip, no trismus, no evidence of peritonsillar abscess or deep tissue infection.  Eyes: Conjunctivae are normal.  Neck: Neck supple.  No nuchal rigidity  Cardiovascular:  Tachycardia without murmurs rubs or gallops  Pulmonary/Chest: Effort normal and breath sounds normal.  Abdominal: Soft. There is no tenderness.  No splenomegaly  Lymphadenopathy:    She has cervical adenopathy.  Neurological: She is alert.  Skin: No rash noted.  Psychiatric: She has a normal mood and affect.  Nursing note and vitals reviewed.   ED Course  Procedures (including critical care time)  Patient here with  worsening sore throat. Per nursing note, possible peritonsillar abscess however on examination, no IS evidence to suggest peritonsillar abscess. Patient has normal voice no evidence of obstructing airway. She is mildly tachycardic, likely secondary to pain. Will check Monospot, provided IV fluid and pain medication along with Magic mouthwash.    1:35 PM Mono test is negative. At this time patient still endorsed sore throat while she is able to tolerates her secretion without difficulty. I will provide symptomatic treatment and ENT referral as needed. Patient also disclosed to me that she has been feeling very stressed out and depressed due to both social environment and school and thought about dropping out of school. She endorses passive suicidal ideation without any specific plan. No homicidal ideation or hallucinations. She admits to self medicating with marijuana, and taking more of her Klonopin than prescribed. She is actively talking to a counselor to help with the problem. She reports poor relationship with her parents at this time.  2:40 PM Patient not disclose to me that she would like inpatient psychiatric help with her depression and passive suicidal ideation. We'll perform medical screening and will discuss with TTS for further management.  2:51 PM TTS has evaluated pt and felt pt meets criteria for psychiatric management.  As far as her pharyngitis, it is likely viral.  COntinue gargle with salt water.  Take OTC medication and lidocaine mouthwash as previously prescribed.  ENT referral given.    Per nursing note: A gentleman named Janalyn Shy phoned to tell me that he is a "former Associate Professor" of hers. He further stated that several of the pt's. College friends called him to tell him that pt. Has been mentioning suicidal thoughts in her recent texts; and that they removed pt's. Medications from her access d/t fears she would overdose. I informed Greta Doom, our P.A. Of this and he had a  subsequent conversation with her. She is tearful and thanks [place] for our care. Mr. Richardson Dopp lists his phone number as [phone].  3:32 PM Evidence of leukocytosis likely secondary to her pharyngitis. Otherwise patient is medically clear. Patient sent over to [facility] for further management.  If pt developed trouble swallowing or difficulty breathing, then she will need to be reevaluated.  At this moment, pt's pregnancy test have not resulted although pt does not think she is pregnant.    Labs Review Labs Reviewed  CBC WITH DIFFERENTIAL/PLATELET - Abnormal; Notable for the following:    WBC 20.5 (*)    Hemoglobin 11.6 (*)    HCT 35.8 (*)    Neutrophils Relative % 80 (*)    Neutro Abs 16.3 (*)    Lymphocytes Relative 8 (*)    Monocytes Absolute 2.5 (*)    All other components within normal limits  COMPREHENSIVE METABOLIC PANEL - Abnormal; Notable for the following:    Glucose, Bld 103 (*)    All other components within normal limits  MONONUCLEOSIS SCREEN  URINE RAPID DRUG SCREEN (HOSP PERFORMED)  ETHANOL  POC URINE PREG, ED    Imaging Review No results found.  EKG Interpretation None      MDM   Final diagnoses:  Pharyngitis  Depression    BP 127/74 mmHg  Pulse 106  Temp(Src) 98.4 F (36.9 C) (Oral)  Resp 17  SpO2 100%      Fayrene Helper, PA-C 06/08/14 1600  Raeford Razor, MD 06/10/14 [phone]

## 2014-06-08 NOTE — Progress Notes (Signed)
Pt has been in her room most of the evening.  She still complains of throat pain.  She denies SI/HI/AV at this time.  Her main concern is the throat pain.  She was given tylenol along with her medications and some hot tea to drink.  She was also given throat lozenges to help sooth her throat.  Pt was encouraged to inform staff if her symptoms worsened.  Pt did not attend any groups tonight.  Support and encouragement offered.  Safety maintained with q15 minute checks.

## 2014-06-08 NOTE — BH Assessment (Signed)
Assessment Note  Shannon [name] is an 24 y.o. female. Patient presented to the ED with a chief complaint of sore throat and depression.  Patient is currently endorsing suicidal ideations with no clear plan.  Patient denies homicidal ideaiotns, hallucinations, and other self-injurious behaviors.  Patient reports being overwhelmed with school being a senior at [employer] and not able to graduate in the spring because of lacking enough credits.  Patient reports the drug activities violence on campus are out of control.  Patient reports her substance use includes marijuana, hallucinogens, and benzodiazepines. Patient reports in the last months using hallucinogens (acid, mushrooms, and mollie) daily and started at the age of 56.  Patient reports abusing her prescribed Klonopin up to 7 0.5mg  pills daily.     CSW consulted with Julieanne Cotton, NP it is recommended to refer for inpatient treatment for safety and stabilization.     Axis I: Substance Induced Mood Disorder and Polysubstance dependence Axis II: Deferred Axis III:  Past Medical History  Diagnosis Date  . Anxiety   . ADHD (attention deficit hyperactivity disorder)   . Depression    Axis IV: economic problems, housing problems, occupational problems, other psychosocial or environmental problems, problems related to social environment, problems with access to health care services and problems with primary support group Axis V: 41-50 serious symptoms  Past Medical History:  Past Medical History  Diagnosis Date  . Anxiety   . ADHD (attention deficit hyperactivity disorder)   . Depression     Past Surgical History  Procedure Laterality Date  . Wisdom tooth extraction      Family History: No family history on file.  Social History:  reports that she has been smoking.  She does not have any smokeless tobacco history on file. She reports that she drinks alcohol. Her drug history is not on file.  Additional Social History:     CIWA:  CIWA-Ar BP: 127/74 mmHg Pulse Rate: 106 COWS:    Allergies:  Allergies  Allergen Reactions  . Lavender Oil     Throat swelling     Home Medications:  (Not in a hospital admission)  OB/GYN Status:  No LMP recorded.  General Assessment Data [facility] of Assessment: [facility] ACT Assessment: Yes Is this a Tele or Face-to-Face Assessment?: Face-to-Face Is this an Initial Assessment or a Re-assessment for this encounter?: Initial Assessment Living Arrangements: Other (Comment) Psychologist, clinical [employer]) Can pt return to current living arrangement?: Yes Admission Status: Voluntary Is patient capable of signing voluntary admission?: Yes Transfer from: Home Referral Source: Self/Family/Friend  Medical Screening Exam [facility]) Medical Exam completed: Yes  [facility] Crisis Care Plan Living Arrangements: Other (Comment) Psychologist, clinical [employer]) Name of Psychiatrist: none Name of Therapist: [facility]  Education Status Is patient currently in school?: Yes Current Grade: Senior Highest grade of school patient has completed: college Name of school: [employer]  Risk to self with the past 6 months Suicidal Ideation: Yes-Currently Present Suicidal Intent: No-Not Currently/Within Last 6 Months Is patient at risk for suicide?: No Suicidal Plan?: No-Not Currently/Within Last 6 Months Specify Current Suicidal Plan: qq Access to Means: No What has been your use of drugs/alcohol within the last 12 months?: THC, Acid, Mollies, Mushroom, Benzo Previous Attempts/Gestures: No Intentional Self Injurious Behavior: None Family Suicide History: No Recent stressful life event(s): Conflict (Comment), Loss (Comment), Other (Comment) Persecutory voices/beliefs?: No Depression: Yes Depression Symptoms: Despondent, Tearfulness, Fatigue, Isolating, Guilt, Loss of interest in usual pleasures, Feeling angry/irritable Substance abuse  history and/or treatment for substance  abuse?: Yes  Risk to Others within the past 6 months Homicidal Ideation: No-Not Currently/Within Last 6 Months Thoughts of Harm to Others: No-Not Currently Present/Within Last 6 Months Current Homicidal Intent: No-Not Currently/Within Last 6 Months Current Homicidal Plan: No-Not Currently/Within Last 6 Months Access to Homicidal Means: No History of harm to others?: No Assessment of Violence: None Noted Does patient have access to weapons?: No Criminal Charges Pending?: No Does patient have a court date: No  Psychosis Hallucinations: None noted Delusions: None noted  Mental Status Report Appearance/Hygiene: In hospital gown Eye Contact: Good Motor Activity: Freedom of movement Speech: Logical/coherent Level of Consciousness: Alert Mood: Depressed Affect: Depressed Anxiety Level: Minimal Thought Processes: Coherent Judgement: Unimpaired Orientation: Person, Place, Time, Situation Obsessive Compulsive Thoughts/Behaviors: None  Cognitive Functioning Concentration: Fair Memory: Recent Intact, Remote Intact IQ: Average Insight: Fair Impulse Control: Fair Appetite: Fair Sleep: Decreased Vegetative Symptoms: None  ADLScreening [facility] Services) Patient's cognitive ability adequate to safely complete daily activities?: Yes Patient able to express need for assistance with ADLs?: Yes Independently performs ADLs?: Yes (appropriate for developmental age)  Prior Inpatient Therapy Prior Inpatient Therapy: No  Prior Outpatient Therapy Prior Outpatient Therapy: Yes Prior Therapy Dates: current Prior Therapy Facilty/Provider(s): [facility] [facility] Reason for Treatment: depression, anxiety  ADL Screening (condition at time of admission) Patient's cognitive ability adequate to safely complete daily activities?: Yes Patient able to express need for assistance with ADLs?: Yes Independently performs ADLs?: Yes (appropriate for developmental age)              Merchant navy officerAdvance Directives (For Healthcare) Does patient have an advance directive?: No Would patient like information on creating an advanced directive?: No - patient declined information    Additional Information 1:1 In Past 12 Months?: No CIRT Risk: No Elopement Risk: No Does patient have medical clearance?: Yes     Disposition:  Disposition Initial Assessment Completed for this Encounter: Yes Disposition of Patient: Inpatient treatment program Type of inpatient treatment program: Adult  On Site Evaluation by:   Reviewed with Physician:    Maryelizabeth Rowanorbett, Wenzel Backlund A 06/08/2014 2:54 PM

## 2014-06-08 NOTE — ED Notes (Addendum)
A gentleman named Janalyn ShySam Cole phoned to tell me that he is a "former Associate Professorstudent advisor" of hers.  He further stated that several of the pt's. College friends called him to tell him that pt. Has been mentioning suicidal thoughts in her recent texts; and that they removed pt's. Medications from her access d/t fears she would overdose.  I informed Greta DoomBowie, our P.A. Of this and he had a subsequent conversation with her.  She is tearful and thanks us for our care.  Mr. Richardson DoppCole lists his phone number as [phone].

## 2014-06-08 NOTE — Consult Note (Signed)
[facility] Face-to-Face Psychiatry Consult   Reason for Consult:  Depressive disorder, Substance induced mood disorder, suicidal ideation Referring Physician:  EDP Patient Identification: Shannon [name] MRN:  [id number] Principal Diagnosis: Substance induced mood disorder Diagnosis:   Patient Active Problem List   Diagnosis Date Noted  . Substance induced mood disorder [F19.94] 06/08/2014    Total Time spent with patient: 1 hour  Subjective:   Shannon [name] is a 24 y.o. 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  She was crying through out this encounter.  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  Patient stated that her school environment is not safe for her to stay till December due to ease of obtaining drugs. .  She reports that she is very depressed and suicidal with plans to OD on pills.  She has been seeing her school counselor for some time but has not seen any improvement.  Patient reports that she has been using illicit drugs and prescribed medications.  Patient reports that she started using Marijuana at age 48 and gradually added "every drug" in the street.  Patient states she  has been using ; Molly, Mushroom, Amphetamines, Marijuana, Cocaine and abusing Klonopin.  Patient reports that she uses more klonopin than prescribed for her and that she buys more off the street.  She states she has been using drugs for some time but has noticed increase in use in the last one month.   She reports that her environment is not safe and that drug dealers are fighting each other every day.  She states that she is not interested in finishing school and has never been interested in school.  She reports that she is 16 credits short of graduation.  Patient is  still feeling suicidal today but denies HI/AVH.  Patient reports poor appetite and sleep.  Patient reports her parents are in [place] and that they do not have good relationship.  She has a sister in [place] who may be of support for her.  She has been accepted for admission and will be waiting for bed availability at any hospital with violable inpatient Psychiatric beds.  HPI Elements:   Location:  Substance induced mood disorder, Polysubstance abuse, Depressive disorder, single episode. Quality:  severe. Severity:  severe. Timing:  Acute. Duration:  since age 32, with gradual increase in use. Context:  Seeking treatment substance abuse and suicidal ideations..  Past Medical History:  Past Medical History  Diagnosis Date  . Anxiety   . ADHD (attention deficit hyperactivity disorder)   . Depression     Past Surgical History  Procedure Laterality Date  . Wisdom tooth extraction     Family History: No family history on file. Social History:  History  Alcohol Use  . Yes     History  Drug Use Not on file    History   Social History  . Marital Status: Single    Spouse Name: N/A  . Number of Children: N/A  . Years of Education: N/A   Social History Main Topics  . Smoking status: Current Every Day Smoker  . Smokeless tobacco: Not on file  . Alcohol Use: Yes  . Drug Use: Not on file  . Sexual Activity: Not on file   Other Topics Concern  .  None   Social History Narrative   Additional Social History:     Allergies:   Allergies  Allergen Reactions  . Lavender Oil     Throat swelling     Labs:  Results for orders placed or performed during the hospital encounter of 06/08/14 (from the past 48 hour(s))  Mononucleosis screen     Status: None   Collection Time: 06/08/14 11:20 AM  Result Value Ref Range   Mono Screen NEGATIVE NEGATIVE    Vitals: Blood pressure 127/74, pulse 106, temperature 98.4 F (36.9 C), temperature source Oral, resp. rate 17, SpO2 100 %.  Risk to  Self: Is patient at risk for suicide?: No Risk to Others:   Prior Inpatient Therapy:   Prior Outpatient Therapy:    No current facility-administered medications for this encounter.   Current Outpatient Prescriptions  Medication Sig Dispense Refill  . amphetamine-dextroamphetamine (ADDERALL XR) 20 MG 24 hr capsule Take 1 capsule by mouth 2 (two) times daily.  0  . APRI 0.15-30 MG-MCG tablet Take 1 tablet by mouth daily.  2  . busPIRone (BUSPAR) 10 MG tablet Take 1 tablet by mouth 3 (three) times daily as needed. anxiety  1  . clonazePAM (KLONOPIN) 0.5 MG tablet Take 1 tablet by mouth 2 (two) times daily as needed. anxiety  1  . ibuprofen (ADVIL,MOTRIN) 200 MG tablet Take 400 mg by mouth every 6 (six) hours as needed for fever or moderate pain.    Marland Kitchen lidocaine (XYLOCAINE) 2 % solution Use as directed 20 mLs in the mouth or throat every 4 (four) hours as needed for mouth pain.     Marland Kitchen venlafaxine XR (EFFEXOR-XR) 75 MG 24 hr capsule Take 1 capsule by mouth 2 (two) times daily.  1  . traMADol (ULTRAM) 50 MG tablet Take 1 tablet (50 mg total) by mouth every 6 (six) hours as needed for severe pain. (Patient not taking: Reported on 06/08/2014) 15 tablet 0    Musculoskeletal: Strength & Muscle Tone: within normal limits Gait & Station: normal Patient leans: N/A  Psychiatric Specialty Exam:     Blood pressure 127/74, pulse 106, temperature 98.4 F (36.9 C), temperature source Oral, resp. rate 17, SpO2 100 %.There is no height or weight on file to calculate BMI.  General Appearance: Casual and Fairly Groomed  Eye Contact::  Good  Speech:  Clear and Coherent and Normal Rate  Volume:  Normal  Mood:  Angry, Anxious, Depressed, Hopeless and helpless  Affect:  Congruent, Depressed and Tearful  Thought Process:  Coherent, Goal Directed and Intact  Orientation:  Full (Time, Place, and Person)  Thought Content:  WDL  Suicidal Thoughts:  Yes.  with intent/plan  Homicidal Thoughts:  No  Memory:   Immediate;   Good Recent;   Good Remote;   Good  Judgement:  Impaired  Insight:  Good  Psychomotor Activity:  Decreased and Psychomotor Retardation  Concentration:  Fair  Recall:  NA  Fund of Knowledge:Fair  Language: Good  Akathisia:  NA  Handed:  Right  AIMS (if indicated):     Assets:  Desire for Improvement  ADL's:  Intact  Cognition: WNL  Sleep:      Medical Decision Making: Review of Psycho-Social Stressors (1), Established Problem, Worsening (2) and Review of Medication Regimen & Side Effects (2)  Treatment Plan Summary: Daily contact with patient to assess and evaluate symptoms and progress in treatment, Medication management and Plan Accepted at [facility] with bed assigned  Plan:  Recommend  psychiatric Inpatient admission when medically cleared. Disposition: see above  Earney NavyONUOHA, JOSEPHINE C    PMHNP-BC 06/08/2014 2:38 PM Patient seen face-to-face for psychiatric evaluation, chart reviewed and case discussed with the physician extender and developed treatment plan. Reviewed the information documented and agree with the treatment plan. Thedore MinsMojeed Cohen Boettner, MD

## 2014-06-08 NOTE — ED Notes (Signed)
Bed: WA08 Expected date:  Expected time:  Means of arrival:  Comments: 

## 2014-06-08 NOTE — ED Notes (Signed)
She leaves with [facility] at this time. She ambulates without difficulty.

## 2014-06-08 NOTE — Discharge Instructions (Signed)
Pharyngitis Pharyngitis is redness, pain, and swelling (inflammation) of your pharynx.  CAUSES  Pharyngitis is usually caused by infection. Most of the time, these infections are from viruses (viral) and are part of a cold. However, sometimes pharyngitis is caused by bacteria (bacterial). Pharyngitis can also be caused by allergies. Viral pharyngitis may be spread from person to person by coughing, sneezing, and personal items or utensils (cups, forks, spoons, toothbrushes). Bacterial pharyngitis may be spread from person to person by more intimate contact, such as kissing.  SIGNS AND SYMPTOMS  Symptoms of pharyngitis include:   Sore throat.   Tiredness (fatigue).   Low-grade fever.   Headache.  Joint pain and muscle aches.  Skin rashes.  Swollen lymph nodes.  Plaque-like film on throat or tonsils (often seen with bacterial pharyngitis). DIAGNOSIS  Your health care provider will ask you questions about your illness and your symptoms. Your medical history, along with a physical exam, is often all that is needed to diagnose pharyngitis. Sometimes, a rapid strep test is done. Other lab tests may also be done, depending on the suspected cause.  TREATMENT  Viral pharyngitis will usually get better in 3-4 days without the use of medicine. Bacterial pharyngitis is treated with medicines that kill germs (antibiotics).  HOME CARE INSTRUCTIONS   Drink enough water and fluids to keep your urine clear or pale yellow.   Only take over-the-counter or prescription medicines as directed by your health care provider:   If you are prescribed antibiotics, make sure you finish them even if you start to feel better.   Do not take aspirin.   Get lots of rest.   Gargle with 8 oz of salt water ( tsp of salt per 1 qt of water) as often as every 1-2 hours to soothe your throat.   Throat lozenges (if you are not at risk for choking) or sprays may be used to soothe your throat. SEEK MEDICAL  CARE IF:   You have large, tender lumps in your neck.  You have a rash.  You cough up green, yellow-brown, or bloody spit. SEEK IMMEDIATE MEDICAL CARE IF:   Your neck becomes stiff.  You drool or are unable to swallow liquids.  You vomit or are unable to keep medicines or liquids down.  You have severe pain that does not go away with the use of recommended medicines.  You have trouble breathing (not caused by a stuffy nose). MAKE SURE YOU:   Understand these instructions.  Will watch your condition.  Will get help right away if you are not doing well or get worse. Document Released: 02/21/2005 Document Revised: 12/12/2012 Document Reviewed: 10/29/2012 [facility] Patient Information 2015 [place], [place]. This information is not intended to replace advice given to you by your health care provider. Make sure you discuss any questions you have with your health care provider.  Major Depressive Disorder Major depressive disorder is a mental illness. It also may be called clinical depression or unipolar depression. Major depressive disorder usually causes feelings of sadness, hopelessness, or helplessness. Some people with this disorder do not feel particularly sad but lose interest in doing things they used to enjoy (anhedonia). Major depressive disorder also can cause physical symptoms. It can interfere with work, school, relationships, and other normal everyday activities. The disorder varies in severity but is longer lasting and more serious than the sadness we all feel from time to time in our lives. Major depressive disorder often is triggered by stressful life events or major  life changes. Examples of these triggers include divorce, loss of your job or home, a move, and the death of a family member or close friend. Sometimes this disorder occurs for no obvious reason at all. People who have family members with major depressive disorder or bipolar disorder are at higher risk for developing  this disorder, with or without life stressors. Major depressive disorder can occur at any age. It may occur just once in your life (single episode major depressive disorder). It may occur multiple times (recurrent major depressive disorder). SYMPTOMS People with major depressive disorder have either anhedonia or depressed mood on nearly a daily basis for at least 2 weeks or longer. Symptoms of depressed mood include:  Feelings of sadness (blue or down in the dumps) or emptiness.  Feelings of hopelessness or helplessness.  Tearfulness or episodes of crying (may be observed by others).  Irritability (children and adolescents). In addition to depressed mood or anhedonia or both, people with this disorder have at least four of the following symptoms:  Difficulty sleeping or sleeping too much.   Significant change (increase or decrease) in appetite or weight.   Lack of energy or motivation.  Feelings of guilt and worthlessness.   Difficulty concentrating, remembering, or making decisions.  Unusually slow movement (psychomotor retardation) or restlessness (as observed by others).   Recurrent wishes for death, recurrent thoughts of self-harm (suicide), or a suicide attempt. People with major depressive disorder commonly have persistent negative thoughts about themselves, other people, and the world. People with severe major depressive disorder may experiencedistorted beliefs or perceptions about the world (psychotic delusions). They also may see or hear things that are not real (psychotic hallucinations). DIAGNOSIS Major depressive disorder is diagnosed through an assessment by your health care provider. Your health care provider will ask aboutaspects of your daily life, such as mood,sleep, and appetite, to see if you have the diagnostic symptoms of major depressive disorder. Your health care provider may ask about your medical history and use of alcohol or drugs, including prescription  medicines. Your health care provider also may do a physical exam and blood work. This is because certain medical conditions and the use of certain substances can cause major depressive disorder-like symptoms (secondary depression). Your health care provider also may refer you to a mental health specialist for further evaluation and treatment. TREATMENT It is important to recognize the symptoms of major depressive disorder and seek treatment. The following treatments can be prescribed for this disorder:   Medicine. Antidepressant medicines usually are prescribed. Antidepressant medicines are thought to correct chemical imbalances in the brain that are commonly associated with major depressive disorder. Other types of medicine may be added if the symptoms do not respond to antidepressant medicines alone or if psychotic delusions or hallucinations occur.  Talk therapy. Talk therapy can be helpful in treating major depressive disorder by providing support, education, and guidance. Certain types of talk therapy also can help with negative thinking (cognitive behavioral therapy) and with relationship issues that trigger this disorder (interpersonal therapy). A mental health specialist can help determine which treatment is best for you. Most people with major depressive disorder do well with a combination of medicine and talk therapy. Treatments involving electrical stimulation of the brain can be used in situations with extremely severe symptoms or when medicine and talk therapy do not work over time. These treatments include electroconvulsive therapy, transcranial magnetic stimulation, and vagal nerve stimulation. Document Released: 06/18/2012 Document Revised: 07/08/2013 Document Reviewed: 06/18/2012 [employer] Patient Information 2015  [employer]. This information is not intended to replace advice given to you by your health care provider. Make sure you discuss any questions you have with your health care  provider.

## 2014-06-08 NOTE — Progress Notes (Signed)
Patient did not attend the evening speaker AA meeting.  Pt was notified that group was beginning but remained in bed. Pt was just newly admitted to the unit.

## 2014-06-08 NOTE — ED Notes (Signed)
While she continues to c/o sore throat; she declines Lidocaine.  I have just given report to BillingsPatty, [facility].

## 2014-06-08 NOTE — Progress Notes (Addendum)
Patient ID: Shannon [name], female   DOB: 01/20/1991, 24 y.o.   MRN: [id number] Pt is a [age]year old voluntary admit who has been feeling suicidal times two weeks. Pt has an allergy to lavender oil. Pt suffers from depression and anxiety. Pt presents with a sore throat and appears pale in color. Pt has a WBC count of 20.5. Pt also c/o right ear pain.Pt states she has been abusing Cocaine, Acid  And  Mollies. Pt at this time does not appear in resp distress. She at 25% of her dinner. Pt has been encouraged to drink plenty of po fluid. Pt stated she did receive morphine for pain in the ER. Pt was given an ice pack to apply to her throat. Spoke with Thurman CoyerEric Kaplan concerning  pt with a WBC count of 20.5. Spoke to Dr. Elna BreslowEappen who stated if pt has any resp problems to send her ASAP back to the ER. Pt at this time is not drooling . Pt is able to swallow but states it is painful to do so. Pulse ox is in the high 90's. Pt was tearful during the admission process stating,"I hate [employer] and want to drop out." Pt stated the main issue is the extensive drug problem at the school and where she lives. Pt does contract for safety and at this time denies Si and HI. Pt told the nurse she is from [place] and is not close to her parents but has a good relationship with her sister.

## 2014-06-08 NOTE — ED Notes (Signed)
She remains in no distress; and is still having throat discomfort.

## 2014-06-09 DIAGNOSIS — F322 Major depressive disorder, single episode, severe without psychotic features: Secondary | ICD-10-CM

## 2014-06-09 DIAGNOSIS — R45851 Suicidal ideations: Secondary | ICD-10-CM

## 2014-06-09 DIAGNOSIS — B9689 Other specified bacterial agents as the cause of diseases classified elsewhere: Secondary | ICD-10-CM

## 2014-06-09 DIAGNOSIS — F1920 Other psychoactive substance dependence, uncomplicated: Principal | ICD-10-CM

## 2014-06-09 DIAGNOSIS — J208 Acute bronchitis due to other specified organisms: Secondary | ICD-10-CM

## 2014-06-09 MED ORDER — SALINE SPRAY 0.65 % NA SOLN
2.0000 | NASAL | Status: DC | PRN
  Administered 2014-06-09: 2 via NASAL
  Filled 2014-06-09 (×2): qty 44

## 2014-06-09 MED ORDER — ALBUTEROL SULFATE HFA 108 (90 BASE) MCG/ACT IN AERS
2.0000 | INHALATION_SPRAY | RESPIRATORY_TRACT | Status: DC | PRN
  Administered 2014-06-09 – 2014-06-12 (×9): 2 via RESPIRATORY_TRACT
  Filled 2014-06-09: qty 6.7

## 2014-06-09 MED ORDER — VENLAFAXINE HCL ER 37.5 MG PO CP24
37.5000 mg | ORAL_CAPSULE | Freq: Every day | ORAL | Status: DC
  Administered 2014-06-09 – 2014-06-11 (×3): 37.5 mg via ORAL
  Filled 2014-06-09 (×5): qty 1

## 2014-06-09 MED ORDER — AZITHROMYCIN 250 MG PO TABS
250.0000 mg | ORAL_TABLET | Freq: Every day | ORAL | Status: AC
  Administered 2014-06-10 – 2014-06-13 (×4): 250 mg via ORAL
  Filled 2014-06-09 (×4): qty 1

## 2014-06-09 MED ORDER — FLUCONAZOLE 100 MG PO TABS: 150.0000 mg | ORAL_TABLET | Freq: Every day | ORAL | Status: DC | PRN

## 2014-06-09 MED ORDER — GUAIFENESIN ER 600 MG PO TB12
1200.0000 mg | ORAL_TABLET | Freq: Two times a day (BID) | ORAL | Status: DC
Start: 2014-06-09 — End: 2014-06-13
  Administered 2014-06-09 – 2014-06-13 (×8): 1200 mg via ORAL
  Filled 2014-06-09 (×11): qty 2

## 2014-06-09 MED ORDER — CEPASTAT 14.5 MG MT LOZG
1.0000 | LOZENGE | OROMUCOSAL | Status: DC | PRN
  Filled 2014-06-09: qty 9

## 2014-06-09 MED ORDER — MENTHOL 3 MG MT LOZG: 1.0000 | LOZENGE | OROMUCOSAL | Status: DC | PRN

## 2014-06-09 MED ORDER — AZITHROMYCIN 500 MG PO TABS
500.0000 mg | ORAL_TABLET | Freq: Every day | ORAL | Status: AC
  Administered 2014-06-09: 500 mg via ORAL
  Filled 2014-06-09: qty 1
  Filled 2014-06-09: qty 2

## 2014-06-09 MED ORDER — HYDROXYZINE HCL 50 MG PO TABS
50.0000 mg | ORAL_TABLET | Freq: Four times a day (QID) | ORAL | Status: AC | PRN
  Administered 2014-06-10 – 2014-06-11 (×4): 50 mg via ORAL
  Filled 2014-06-09 (×4): qty 1

## 2014-06-09 NOTE — Plan of Care (Signed)
Problem: Ineffective individual coping Goal: STG: Patient will remain free from self harm Outcome: Progressing Patient remains free from self harm. 15 minute checks continued per protocol for patient safety.

## 2014-06-09 NOTE — Progress Notes (Signed)
Patient did attend the evening speaker AA meeting.  

## 2014-06-09 NOTE — Progress Notes (Signed)
Recreation Therapy Notes  Date: 04.04.2016 Time: 9:30am Location: [address] Group Room   Group Topic: Stress Management  Goal Area(s) Addresses:  Patient will actively participate in stress management techniques presented during session.   Behavioral Response: Did not attend.   Marykay Lexenise L Joie Reamer, LRT/CTRS  Shannon [name] L 06/09/2014 3:03 PM

## 2014-06-09 NOTE — Progress Notes (Signed)
Patient appeared to be doing better. She reported feeling slightly all right. Her friend came to visit her and she seemed to have good visit. She denied SI/HI and denied Hallucinations. Mood and affects flat and depressed. She requested for her inhaler. Writer encouraged and supported patient. Q 15 minute check continues as ordered to maintain safety.

## 2014-06-09 NOTE — Tx Team (Signed)
Interdisciplinary Treatment Plan Update (Adult)   Date: 06/09/2014   Time Reviewed: 9:37 AM  Progress in Treatment:  Attending groups: No  Participating in groups: no   Taking medication as prescribed: Yes  Tolerating medication: Yes  Family/Significant othe contact made: Not yet. SPE required for this pt.   Patient understands diagnosis: Yes, AEB seeking treatment for depression, SI, med stabilization, and due to polysubstance abuse (hallucinogens, benzos, and marijuana).  Discussing patient identified problems/goals with staff: Yes  Medical problems stabilized or resolved: Yes  Denies suicidal/homicidal ideation: Yes, during self report.  Patient has not harmed self or Others: Yes  New problem(s) identified:  Discharge Plan or Barriers: Pt did not attend morning discharge planning group. CSW assessing for appropriate referrals. PSA needed for this pt.  Additional comments: Shannon [name] is an 24 y.o. female. Patient presented to the ED with a chief complaint of sore throat and depression. Patient is currently endorsing suicidal ideations with no clear plan. Patient denies homicidal ideaiotns, hallucinations, and other self-injurious behaviors. Patient reports being overwhelmed with school being a senior at [employer] and not able to graduate in the spring because of lacking enough credits. Patient reports the drug activities violence on campus are out of control. Patient reports her substance use includes marijuana, hallucinogens, and benzodiazepines. Patient reports in the last months using hallucinogens (acid, mushrooms, and mollie) daily and started at the age of 24. Patient reports abusing her prescribed Klonopin up to 7 0.5mg  pills daily.  Reason for Continuation of Hospitalization: Librium/clonidine taper-withdrawals Depression/Mood instability Medication management  Estimated length of stay: 3-5 days  For review of initial/current patient goals, please see plan of care.   Attendees:  Patient:    Family:    Physician: Dr. Jama Flavorsobos MD 06/09/2014 9:41 AM   Nursing: Meriam SpragueBeverly RN; Farley LyBrittany G. RN 06/09/2014 9:41 AM   Clinical Social Worker Gretta Samons Smart, LCSWA  06/09/2014 9:41 AM   Other: Liam GrahamKristin D. LCSWA; Quylle LCSW 06/09/2014 9:41 AM   Other: Darden DatesJennifer C. Nurse CM 06/09/2014 9:44 AM   Other: Liliane Badeolora Sutton, Community Care Coordinator  06/09/2014 9:44 AM   Other:    Scribe for Treatment Team:  The Sherwin-WilliamsHeather Smart LCSWA 06/09/2014 9:44 AM

## 2014-06-09 NOTE — [facility] Group Notes (Signed)
[facility] LCSW Group Therapy  06/09/2014 2:01 PM  Type of Therapy:  Group Therapy  Participation Level:  Did Not Attend-pt was meeting with provider during group.   Summary of Progress/Problems: Today's Topic: Overcoming Obstacles. Pt identified obstacles faced currently and processed barriers involved in overcoming these obstacles. Pt identified steps necessary for overcoming these obstacles and explored motivation (internal and external) for facing these difficulties head on. Pt further identified one area of concern in their lives and chose a skill of focus pulled from their "toolbox."   Smart, Castle DaleHeather LCSWA  06/09/2014, 2:01 PM

## 2014-06-09 NOTE — [facility] Suicide Risk Assessment (Addendum)
[facility] Admission Suicide Risk Assessment   Nursing information obtained from:  Patient Demographic factors:  Caucasian Current Mental Status:  Suicidal ideation indicated by patient Loss Factors:   failing grades, stress with boyfriend, recent breakup, and tense relationship with parents  Historical Factors:   history of polysubstance abuse  Risk Reduction Factors:  Sense of responsibility to family, Religious beliefs about death Total Time spent with patient: 45 minutes Principal Problem: polysubstance Dependence , MDD versus Substance Induced Mood Disorder  Diagnosis:   Patient Active Problem List   Diagnosis Date Noted  . Substance induced mood disorder [F19.94] 06/08/2014  . Depression [F32.9]      Continued Clinical Symptoms:    The "Alcohol Use Disorders Identification Test", Guidelines for Use in Primary Care, Second Edition.  World Science writer [facility]). Score between 0-7:  no or low risk or alcohol related problems. Score between 8-15:  moderate risk of alcohol related problems. Score between 16-19:  high risk of alcohol related problems. Score 20 or above:  warrants further diagnostic evaluation for alcohol dependence and treatment.   CLINICAL FACTORS:  24 year old single female, college student , reports worsening depression and was admitted after she came to the ED complaining of sore throat, but someone from her college called to state that she had been experiencing depression and suicidal ideations, which she acknowledged. This resulted in her admission to Psych unit- signed voluntarily. At this time she describes a history of abusing drugs since high school, to include cannabis, LSD, other hallucinogens, Xanax, Klonopin, NMDA, Cocaine.  Denies any alcohol abuse. Over recent weeks has been  Feeling worse. Describes difficult relationship with her parents who have not been supportive as per her, and recent break up with BF. Presents depressed, dysphoric, but not suicidal  currently, no psychotic symptoms.    Musculoskeletal: Strength & Muscle Tone: within normal limits Gait & Station: normal Patient leans: N/A  Psychiatric Specialty Exam: Physical Exam  ROS  Blood pressure 114/65, pulse 97, temperature 98.2 F (36.8 C), temperature source Oral, resp. rate 16, height  (1.676 m), weight 126 lb (57.153 kg), SpO2 99 %.Body mass index is 20.35 kg/(m^2).  General Appearance: Fairly Groomed  Patent attorney::  Good  Speech:  Normal Rate  Volume:  Decreased  Mood:  Depressed  Affect:  Constricted  Thought Process:  Goal Directed and Linear  Orientation:  Full (Time, Place, and Person)  Thought Content:  no hallucinations, no delusions  Suicidal Thoughts:  No  Denies any thoughts of hurting self or anyone else   Homicidal Thoughts:  No  Memory:  recent and remote grossly intact   Judgement:  Fair  Insight:  Present  Psychomotor Activity:  Decreased  Concentration:  Good  Recall:  Good  Fund of Knowledge:Good  Language: Good  Akathisia:  Negative  Handed:  Right  AIMS (if indicated):     Assets:  Desire for Improvement Physical Health Resilience  Sleep:  Number of Hours: 5.75  Cognition: WNL  ADL's: fair      COGNITIVE FEATURES THAT CONTRIBUTE TO RISK:  Closed-mindedness    SUICIDE RISK:   Moderate:  Frequent suicidal ideation with limited intensity, and duration, some specificity in terms of plans, no associated intent, good self-control, limited dysphoria/symptomatology, some risk factors present, and identifiable protective factors, including available and accessible social support.  PLAN OF CARE: Patient will be admitted to inpatient psychiatric unit for stabilization and safety. Will provide and encourage milieu participation. Provide medication management and  maked adjustments as needed.  Will also provide medication management to minimize the risk of BZD withdrawal. As she was not abusing opiates , will D/C  Clonidine detox taper. Will  follow daily.    Medical Decision Making:  Review of Psycho-Social Stressors (1), Review or order clinical lab tests (1), Established Problem, Worsening (2) and Review of Medication Regimen & Side Effects (2)  I certify that inpatient services furnished can reasonably be expected to improve the patient's condition.   COBOS, FERNANDO 06/09/2014, 3:29 PM

## 2014-06-09 NOTE — [facility] Group Notes (Signed)
[facility]   06/09/2014 9:16 AM  Participation Quality:  Invited-pt in room sleeping. Did not attend   Smart, Coal Near[place] LCSWA

## 2014-06-09 NOTE — H&P (Signed)
Psychiatric Admission Assessment Adult  Patient Identification: Shannon [name] MRN:  [id number] Date of Evaluation:  06/09/2014 Chief Complaint:  substance induced mood disorder polysubstance abuse Principal Diagnosis: Polysubstance dependence Diagnosis:   Patient Active Problem List   Diagnosis Date Noted  . Polysubstance dependence [F19.20]     Priority: High  . Acute bronchitis, bacterial [J00] 06/09/2014  . 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 Substance induced mood disorder [F19.94] 06/08/2014   Subjective: Pt seen and chart reviewed. Pt is minimizing suicidal ideation, denies HI and AVH. Pt reports that she came to the ED in a state of despair with suicidal thoughts, generalized, without specific plans or intention to follow through. Pt reports that her primary stressor is "living with 5 drug dealers" which she reports are "not her friends" but people who give her "weed, shrooms, and acid". Pt reports that she wants to get clean and go to long-term rehab as she feels it is ruining her life. She also reports that she is able to graduate this fall but feels so depressed that she does not want to graduate because she "hates school and never wanted to go to school in the first place". Pt reports a sore throat x 4 days and sounds hoarse when speaking. Pt also reports a productive cough with green/yellow sputum and difficulty breathing at night due to coughing. Pt's WBC's are over 20k and throat is moderately red.   Pt reports a current psychiatrist and wants to continue followup after a rehab placement is complete. Pt receptive to medications but wants to try Effexor and coping skills for anxiety and would like to stick with this monotherapy with PRN's in hopes that she can develop her coping skills rather than "relying on medications" like she has with drug use.   History of Present Illness::  Shannon [name] is an 24 y.o. female. Patient  presented to the ED with a chief complaint of sore throat and depression. Patient is currently endorsing suicidal ideations with no clear plan. Patient denies homicidal ideaiotns, hallucinations, and other self-injurious behaviors. Patient reports being overwhelmed with school being a senior at [employer] and not able to graduate in the spring because of lacking enough credits. Patient reports the drug activities violence on campus are out of control. Patient reports her substance use includes marijuana, hallucinogens, and benzodiazepines. Patient reports in the last months using hallucinogens (acid, mushrooms, and mollie) daily and started at the age of 76. Patient reports abusing her prescribed Klonopin up to 7 0.$Remo'5mg'OhczS$  pills daily.    Elements:  Location:  Psychiatric. Quality:  Worsening. Severity:  Severe. Timing:  Constant. Duration:  Chronic. Context:  Exacerbation of underlying substance induced mood disorder secondary to pt feeling overwhelmed with family strain and drug addiction. Associated Signs/Symptoms: Depression Symptoms:  depressed mood, feelings of worthlessness/guilt, difficulty concentrating, hopelessness, recurrent thoughts of death, suicidal thoughts without plan, anxiety, panic attacks, (Hypo) Manic Symptoms:  Irritable Mood, Anxiety Symptoms:  Excessive Worry, Panic Symptoms, Psychotic Symptoms:  None PTSD Symptoms: NA Total Time spent with patient: 45 minutes  Past Medical History:  Past Medical History  Diagnosis Date  . Anxiety   . ADHD (attention deficit hyperactivity disorder)   . Depression     Past Surgical History  Procedure Laterality Date  . Wisdom tooth extraction     Family History: History reviewed. No pertinent family history. Social History:  History  Alcohol Use  . Yes     History  Drug  Use Not on file    History   Social History  . Marital Status: Single    Spouse Name: N/A  . Number of Children: N/A  . Years of  Education: N/A   Social History Main Topics  . Smoking status: Current Every Day Smoker  . Smokeless tobacco: Not on file  . Alcohol Use: Yes  . Drug Use: Not on file  . Sexual Activity: Not on file   Other Topics Concern  . None   Social History Narrative   Additional Social History:                         Musculoskeletal: Strength & Muscle Tone: within normal limits Gait & Station: normal Patient leans: N/A  Psychiatric Specialty Exam: Physical Exam  ROS  Blood pressure 114/65, pulse 97, temperature 98.2 F (36.8 C), temperature source Oral, resp. rate 16, height $RemoveBe'5\' 6"'qNorqHHeF$  (1.676 m), weight 57.153 kg (126 lb), SpO2 99 %.Body mass index is 20.35 kg/(m^2).  General Appearance: Casual and Fairly Groomed  Engineer, water::  Good  Speech:  Clear and Coherent  Volume:  Normal  Mood:  Depressed  Affect:  Depressed  Thought Process:  Circumstantial, Coherent, Goal Directed and Intact  Orientation:  Full (Time, Place, and Person)  Thought Content:  WDL  Suicidal Thoughts:  Yes.  without intent/plan  Homicidal Thoughts:  No  Memory:  Immediate;   Fair Recent;   Fair Remote;   Fair  Judgement:  Fair  Insight:  Fair  Psychomotor Activity:  Normal  Concentration:  Good  Recall:  Good  Fund of Knowledge:Fair  Language: Fair  Akathisia:  No  Handed:    AIMS (if indicated):     Assets:  Communication Skills Desire for Improvement Resilience  ADL's:  Intact  Cognition: WNL  Sleep:  Number of Hours: 5.75   Risk to Self: Is patient at risk for suicide?: Yes Risk to Others:   Prior Inpatient Therapy:   Prior Outpatient Therapy:    Alcohol Screening: Patient refused Alcohol Screening Tool: Yes 1. How often do you have a drink containing alcohol?: Monthly or less 3. How often do you have six or more drinks on one occasion?: Never Brief Intervention: Yes  Allergies:   Allergies  Allergen Reactions  . Lavender Oil     Throat swelling    Lab Results:  Results  for orders placed or performed during the hospital encounter of 06/08/14 (from the past 48 hour(s))  Mononucleosis screen     Status: None   Collection Time: 06/08/14 11:20 AM  Result Value Ref Range   Mono Screen NEGATIVE NEGATIVE  CBC with Differential/Platelet     Status: Abnormal   Collection Time: 06/08/14 11:36 AM  Result Value Ref Range   WBC 20.5 (H) 4.0 - 10.5 K/uL   RBC 3.88 3.87 - 5.11 MIL/uL   Hemoglobin 11.6 (L) 12.0 - 15.0 g/dL   HCT 35.8 (L) 36.0 - 46.0 %   MCV 92.3 78.0 - 100.0 fL   MCH 29.9 26.0 - 34.0 pg   MCHC 32.4 30.0 - 36.0 g/dL   RDW 13.1 11.5 - 15.5 %   Platelets 241 150 - 400 K/uL   Neutrophils Relative % 80 (H) 43 - 77 %   Neutro Abs 16.3 (H) 1.7 - 7.7 K/uL   Lymphocytes Relative 8 (L) 12 - 46 %   Lymphs Abs 1.7 0.7 - 4.0 K/uL   Monocytes Relative 12  3 - 12 %   Monocytes Absolute 2.5 (H) 0.1 - 1.0 K/uL   Eosinophils Relative 0 0 - 5 %   Eosinophils Absolute 0.1 0.0 - 0.7 K/uL   Basophils Relative 0 0 - 1 %   Basophils Absolute 0.0 0.0 - 0.1 K/uL  Comprehensive metabolic panel     Status: Abnormal   Collection Time: 06/08/14 11:36 AM  Result Value Ref Range   Sodium 136 135 - 145 mmol/L   Potassium 3.8 3.5 - 5.1 mmol/L   Chloride 106 96 - 112 mmol/L   CO2 20 19 - 32 mmol/L   Glucose, Bld 103 (H) 70 - 99 mg/dL   BUN 9 6 - 23 mg/dL   Creatinine, Ser 0.68 0.50 - 1.10 mg/dL   Calcium 8.8 8.4 - 10.5 mg/dL   Total Protein 7.0 6.0 - 8.3 g/dL   Albumin 4.0 3.5 - 5.2 g/dL   AST 21 0 - 37 U/L   ALT 13 0 - 35 U/L   Alkaline Phosphatase 53 39 - 117 U/L   Total Bilirubin 0.5 0.3 - 1.2 mg/dL   GFR calc non Af Amer >90 >90 mL/min   GFR calc Af Amer >90 >90 mL/min    Comment: (NOTE) The eGFR has been calculated using the CKD EPI equation. This calculation has not been validated in all clinical situations. eGFR's persistently <90 mL/min signify possible Chronic Kidney Disease.    Anion gap 10 5 - 15  Ethanol     Status: None   Collection Time: 06/08/14   3:20 PM  Result Value Ref Range   Alcohol, Ethyl (B) <5 0 - 9 mg/dL    Comment:        LOWEST DETECTABLE LIMIT FOR SERUM ALCOHOL IS 11 mg/dL FOR MEDICAL PURPOSES ONLY   Urine rapid drug screen (hosp performed)     Status: Abnormal   Collection Time: 06/08/14  3:41 PM  Result Value Ref Range   Opiates POSITIVE (A) NONE DETECTED   Cocaine NONE DETECTED NONE DETECTED   Benzodiazepines NONE DETECTED NONE DETECTED   Amphetamines NONE DETECTED NONE DETECTED   Tetrahydrocannabinol POSITIVE (A) NONE DETECTED   Barbiturates NONE DETECTED NONE DETECTED    Comment:        DRUG SCREEN FOR MEDICAL PURPOSES ONLY.  IF CONFIRMATION IS NEEDED FOR ANY PURPOSE, NOTIFY LAB WITHIN 5 DAYS.        LOWEST DETECTABLE LIMITS FOR URINE DRUG SCREEN Drug Class       Cutoff (ng/mL) Amphetamine      1000 Barbiturate      200 Benzodiazepine   098 Tricyclics       119 Opiates          300 Cocaine          300 THC              50   POC urine preg, ED (not at [facility])     Status: None   Collection Time: 06/08/14  3:52 PM  Result Value Ref Range   Preg Test, Ur NEGATIVE NEGATIVE    Comment:        THE SENSITIVITY OF THIS METHODOLOGY IS >24 mIU/mL    Current Medications: Current Facility-Administered Medications  Medication Dose Route Frequency Provider Last Rate Last Dose  . acetaminophen (TYLENOL) tablet 650 mg  650 mg Oral Q6H PRN Ursula Alert, MD   650 mg at 06/08/14 2215  . albuterol (PROVENTIL HFA;VENTOLIN HFA) 108 (90 BASE) MCG/ACT inhaler  2 puff  2 puff Inhalation Q4H PRN Benjamine Mola, FNP   2 puff at 06/09/14 2004  . alum & mag hydroxide-simeth (MAALOX/MYLANTA) 200-200-20 MG/5ML suspension 30 mL  30 mL Oral Q4H PRN Ursula Alert, MD      . Derrill Memo ON 06/10/2014] azithromycin (ZITHROMAX) tablet 250 mg  250 mg Oral Daily Benjamine Mola, FNP      . chlordiazePOXIDE (LIBRIUM) capsule 25 mg  25 mg Oral Q6H PRN Ursula Alert, MD      . chlordiazePOXIDE (LIBRIUM) capsule 25 mg  25 mg Oral TID Ursula Alert, MD   25 mg at 06/09/14 1652   Followed by  . [START ON 06/10/2014] chlordiazePOXIDE (LIBRIUM) capsule 25 mg  25 mg Oral BH-qamhs Ursula Alert, MD       Followed by  . [START ON 06/12/2014] chlordiazePOXIDE (LIBRIUM) capsule 25 mg  25 mg Oral Daily Saramma Eappen, MD      . dicyclomine (BENTYL) tablet 20 mg  20 mg Oral Q6H PRN Saramma Eappen, MD      . fluconazole (DIFLUCAN) tablet 150 mg  150 mg Oral Daily PRN Benjamine Mola, FNP      . guaiFENesin (MUCINEX) 12 hr tablet 1,200 mg  1,200 mg Oral BID Benjamine Mola, FNP   1,200 mg at 06/09/14 1448  . hydrOXYzine (ATARAX/VISTARIL) tablet 50 mg  50 mg Oral Q6H PRN Benjamine Mola, FNP      . loperamide (IMODIUM) capsule 2-4 mg  2-4 mg Oral PRN Ursula Alert, MD      . magnesium hydroxide (MILK OF MAGNESIA) suspension 30 mL  30 mL Oral Daily PRN Ursula Alert, MD      . menthol-cetylpyridinium (CEPACOL) lozenge 3 mg  1 lozenge Oral PRN Nicholaus Bloom, MD      . methocarbamol (ROBAXIN) tablet 500 mg  500 mg Oral Q8H PRN Ursula Alert, MD      . multivitamin with minerals tablet 1 tablet  1 tablet Oral Daily Ursula Alert, MD   1 tablet at 06/09/14 0847  . naproxen (NAPROSYN) tablet 500 mg  500 mg Oral BID PRN Ursula Alert, MD   500 mg at 06/09/14 2137  . ondansetron (ZOFRAN-ODT) disintegrating tablet 4 mg  4 mg Oral Q6H PRN Saramma Eappen, MD      . sodium chloride (OCEAN) 0.65 % nasal spray 2 spray  2 spray Each Nare PRN Benjamine Mola, FNP   2 spray at 06/09/14 1449  . thiamine (B-1) injection 100 mg  100 mg Intramuscular Once Ursula Alert, MD   100 mg at 06/08/14 2000  . thiamine (VITAMIN B-1) tablet 100 mg  100 mg Oral Daily Ursula Alert, MD   100 mg at 06/09/14 0847  . venlafaxine XR (EFFEXOR-XR) 24 hr capsule 37.5 mg  37.5 mg Oral Q breakfast Benjamine Mola, FNP   37.5 mg at 06/09/14 1448   PTA Medications: Prescriptions prior to admission  Medication Sig Dispense Refill Last Dose  . busPIRone (BUSPAR) 10 MG tablet Take 1 tablet  by mouth 3 (three) times daily as needed. anxiety  1 Past Week at Unknown time  . ibuprofen (ADVIL,MOTRIN) 200 MG tablet Take 400 mg by mouth every 6 (six) hours as needed for fever or moderate pain.   Past Week at Unknown time  . lidocaine (XYLOCAINE) 2 % solution Use as directed 20 mLs in the mouth or throat every 4 (four) hours as needed for mouth pain.    Past  Week at Unknown time  . venlafaxine XR (EFFEXOR-XR) 75 MG 24 hr capsule Take 1 capsule by mouth 2 (two) times daily.  1 Past Week at Unknown time    Previous Psychotropic Medications:  Buspar, Effexor, Wellbutrin, Celexa    Substance Abuse History in the last 12 months:  Yes.     Consequences of Substance Abuse: Medical Consequences:  Hospitalization  Results for orders placed or performed during the hospital encounter of 06/08/14 (from the past 72 hour(s))  Mononucleosis screen     Status: None   Collection Time: 06/08/14 11:20 AM  Result Value Ref Range   Mono Screen NEGATIVE NEGATIVE  CBC with Differential/Platelet     Status: Abnormal   Collection Time: 06/08/14 11:36 AM  Result Value Ref Range   WBC 20.5 (H) 4.0 - 10.5 K/uL   RBC 3.88 3.87 - 5.11 MIL/uL   Hemoglobin 11.6 (L) 12.0 - 15.0 g/dL   HCT 35.8 (L) 36.0 - 46.0 %   MCV 92.3 78.0 - 100.0 fL   MCH 29.9 26.0 - 34.0 pg   MCHC 32.4 30.0 - 36.0 g/dL   RDW 13.1 11.5 - 15.5 %   Platelets 241 150 - 400 K/uL   Neutrophils Relative % 80 (H) 43 - 77 %   Neutro Abs 16.3 (H) 1.7 - 7.7 K/uL   Lymphocytes Relative 8 (L) 12 - 46 %   Lymphs Abs 1.7 0.7 - 4.0 K/uL   Monocytes Relative 12 3 - 12 %   Monocytes Absolute 2.5 (H) 0.1 - 1.0 K/uL   Eosinophils Relative 0 0 - 5 %   Eosinophils Absolute 0.1 0.0 - 0.7 K/uL   Basophils Relative 0 0 - 1 %   Basophils Absolute 0.0 0.0 - 0.1 K/uL  Comprehensive metabolic panel     Status: Abnormal   Collection Time: 06/08/14 11:36 AM  Result Value Ref Range   Sodium 136 135 - 145 mmol/L   Potassium 3.8 3.5 - 5.1 mmol/L   Chloride  106 96 - 112 mmol/L   CO2 20 19 - 32 mmol/L   Glucose, Bld 103 (H) 70 - 99 mg/dL   BUN 9 6 - 23 mg/dL   Creatinine, Ser 0.68 0.50 - 1.10 mg/dL   Calcium 8.8 8.4 - 10.5 mg/dL   Total Protein 7.0 6.0 - 8.3 g/dL   Albumin 4.0 3.5 - 5.2 g/dL   AST 21 0 - 37 U/L   ALT 13 0 - 35 U/L   Alkaline Phosphatase 53 39 - 117 U/L   Total Bilirubin 0.5 0.3 - 1.2 mg/dL   GFR calc non Af Amer >90 >90 mL/min   GFR calc Af Amer >90 >90 mL/min    Comment: (NOTE) The eGFR has been calculated using the CKD EPI equation. This calculation has not been validated in all clinical situations. eGFR's persistently <90 mL/min signify possible Chronic Kidney Disease.    Anion gap 10 5 - 15  Ethanol     Status: None   Collection Time: 06/08/14  3:20 PM  Result Value Ref Range   Alcohol, Ethyl (B) <5 0 - 9 mg/dL    Comment:        LOWEST DETECTABLE LIMIT FOR SERUM ALCOHOL IS 11 mg/dL FOR MEDICAL PURPOSES ONLY   Urine rapid drug screen (hosp performed)     Status: Abnormal   Collection Time: 06/08/14  3:41 PM  Result Value Ref Range   Opiates POSITIVE (A) NONE DETECTED   Cocaine NONE DETECTED  NONE DETECTED   Benzodiazepines NONE DETECTED NONE DETECTED   Amphetamines NONE DETECTED NONE DETECTED   Tetrahydrocannabinol POSITIVE (A) NONE DETECTED   Barbiturates NONE DETECTED NONE DETECTED    Comment:        DRUG SCREEN FOR MEDICAL PURPOSES ONLY.  IF CONFIRMATION IS NEEDED FOR ANY PURPOSE, NOTIFY LAB WITHIN 5 DAYS.        LOWEST DETECTABLE LIMITS FOR URINE DRUG SCREEN Drug Class       Cutoff (ng/mL) Amphetamine      1000 Barbiturate      200 Benzodiazepine   233 Tricyclics       435 Opiates          300 Cocaine          300 THC              50   POC urine preg, ED (not at [facility])     Status: None   Collection Time: 06/08/14  3:52 PM  Result Value Ref Range   Preg Test, Ur NEGATIVE NEGATIVE    Comment:        THE SENSITIVITY OF THIS METHODOLOGY IS >24 mIU/mL     Observation Level/Precautions:   15 minute checks  Laboratory:  Labs resulted, reviewed, and stable at this time.   Psychotherapy:  Group therapy, individual therapy, psychoeducation  Medications:  See MAR above  Consultations: None    Discharge Concerns: None    Estimated LOS: 5-7 days  Other:  N/A    Treatment Plan Summary: Daily contact with patient to assess and evaluate symptoms and progress in treatment  -Restart home Effexor XR at 37.$Remove'5mg'jBrCCqr$  daily for mood stabilization of anxiety and depression -Increase Vistaril to $RemoveBef'50mg'pYbghkSELp$  q6h PRN anxiety due to high medication tolerance for anxiolytics -Z-pak for Acute Bacterial Bronchitis  -Cepachol lozenges PRN for sore throat -Mucinex $RemoveBeforeD'1200mg'zieoFCJeIQGCbn$  q12h PRN for expectorant/cough -Albuterol inhaler 2 puffs q6h PRN  -Fluconazole $RemoveBefor'150mg'hafuOPbzSoWs$  x1 PRN (1 dose for nursing to use if pt presents to them with vaginal candidiasis from abx admin)  Medical Decision Making:  Review of Psycho-Social Stressors (1), Review or order clinical lab tests (1), Established Problem, Worsening (2) and Review or order medicine tests (1)  I certify that inpatient services furnished can reasonably be expected to improve the patient's condition.   Benjamine Mola, FNP-BC 4/4/20162:18 PM   I have discussed case with NP and have met with patient. Agree with NP's Note and Assessment 24 year old single female, college student , reports worsening depression and was admitted after she came to the ED complaining of sore throat, but someone from her college called to state that she had been experiencing depression and suicidal ideations, which she acknowledged. This resulted in her admission to Psych unit- signed voluntarily. At this time she describes a history of abusing drugs since high school, to include cannabis, LSD, other hallucinogens, Xanax, Klonopin, NMDA, Cocaine. Denies any alcohol abuse. Over recent weeks has been Feeling worse. Describes difficult relationship with her parents who have not been supportive as per  her, and recent break up with BF. Presents depressed, dysphoric, but not suicidal currently, no psychotic symptoms.

## 2014-06-09 NOTE — Progress Notes (Signed)
D: Patient is alert and oriented. Pt's mood and affect is pleasant and blunted. Pt denies SI/HI and AVH. Pt complains of sore throat 4/10 post medication administration, throat/tonsils observed to be swollen and red with some discharge present, pt reports difficulty swallowing food and medications. Pt denies withdrawal symptoms. Pt refused 1700 dose of librium d/t pain with swallowing and denies withdrawal symptoms. Pt hypotensive this evening, denies symptoms, BP increased post fluids. Pt not attending groups and remains in bed resting. A: Pt given cold fluids, pudding, and ice cream per pt request for sore throat. Pt given fluids, will reassess BP frequently. Encouragement/Support provided to pt. Active listening by RN. MD Cobos made aware of pt's complaints. Medication education reviewed with pt. PRN medication administered for pain per providers orders (See MAR). Scheduled medications administered per providers orders (See MAR). 15 minute checks continued per protocol for patient safety.  R: Patient cooperative and receptive to nursing interventions. Pt not adherent to unit group schedule. Pt remains safe.

## 2014-06-10 DIAGNOSIS — F322 Major depressive disorder, single episode, severe without psychotic features: Secondary | ICD-10-CM

## 2014-06-10 LAB — URINE MICROSCOPIC-ADD ON

## 2014-06-10 LAB — URINALYSIS, ROUTINE W REFLEX MICROSCOPIC
BILIRUBIN URINE: NEGATIVE
Glucose, UA: NEGATIVE mg/dL
Ketones, ur: NEGATIVE mg/dL
NITRITE: POSITIVE — AB
Protein, ur: NEGATIVE mg/dL
SPECIFIC GRAVITY, URINE: 1.01 (ref 1.005–1.030)
Urobilinogen, UA: 0.2 mg/dL (ref 0.0–1.0)
pH: 6 (ref 5.0–8.0)

## 2014-06-10 LAB — HEPATITIS C ANTIBODY: HCV Ab: NEGATIVE

## 2014-06-10 LAB — HEPATITIS B SURFACE ANTIGEN: HEP B S AG: NEGATIVE

## 2014-06-10 LAB — T4, FREE: Free T4: 1.14 ng/dL (ref 0.80–1.80)

## 2014-06-10 LAB — HIV ANTIBODY (ROUTINE TESTING W REFLEX): HIV Screen 4th Generation wRfx: NONREACTIVE

## 2014-06-10 LAB — TSH: TSH: 2.082 u[IU]/mL (ref 0.350–4.500)

## 2014-06-10 LAB — RPR: RPR Ser Ql: NONREACTIVE

## 2014-06-10 NOTE — [facility] Group Notes (Signed)
[facility] LCSW Group Therapy  06/10/2014 1:26 PM  Type of Therapy:  Group Therapy  Participation Level:  Did Not Attend-invited/pt chose to remain in room to rest.  Summary of Progress/Problems: [facility] Speaker came to talk about his personal journey with substance abuse and addiction. The pt processed ways by which to relate to the speaker. [facility] speaker provided handouts and educational information pertaining to groups and [facility].   Smart, Shannon [name] LCSWA 06/10/2014, 1:26 PM

## 2014-06-10 NOTE — Progress Notes (Signed)
[facility] MD Progress Note  06/10/2014 6:11 PM Dezra Mandella  MRN:  [id number] Subjective:  States she went to the ED to address the upper respiratory symptoms and when she was given the chance of talking about what she was going trough emotionally she broke down. She admits she has been under a lot of stress. She is not doing well at school, she states she lives in the dormitory where she is constantly being triggered to use drugs. She states that she is worried as she does not know what to do. 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  Principal Problem: Polysubstance dependence Diagnosis:   Patient Active Problem List   Diagnosis Date Noted  . Acute bronchitis, bacterial [J00] 06/09/2014  . Polysubstance dependence [F19.20]   . 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 Substance induced mood disorder [F19.94] 06/08/2014   Total Time spent with patient: 30 minutes   Past Medical History:  Past Medical History  Diagnosis Date  . Anxiety   . ADHD (attention deficit hyperactivity disorder)   . Depression     Past Surgical History  Procedure Laterality Date  . Wisdom tooth extraction     Family History: History reviewed. No pertinent family history. Social History:  History  Alcohol Use  . Yes     History  Drug Use Not on file    History   Social History  . Marital Status: Single    Spouse Name: N/A  . Number of Children: N/A  . Years of Education: N/A   Social History Main Topics  . Smoking status: Current Every Day Smoker  . Smokeless  tobacco: Not on file  . Alcohol Use: Yes  . Drug Use: Not on file  . Sexual Activity: Not on file   Other Topics Concern  . None   Social History Narrative   Additional History:    Sleep: Poor  Appetite:  Fair   Assessment:   Musculoskeletal: Strength & Muscle Tone: within normal limits Gait & Station: normal Patient leans: N/A   Psychiatric Specialty Exam: Physical Exam  Review of Systems  Constitutional: Negative.   HENT: Negative.   Eyes: Negative.   Respiratory: Positive for shortness of breath.   Cardiovascular: Negative.   Gastrointestinal: Negative.   Genitourinary: Negative.   Musculoskeletal: Negative.   Skin: Negative.   Neurological: Negative.   Endo/Heme/Allergies: Negative.   Psychiatric/Behavioral: Positive for depression and substance abuse. The patient is nervous/anxious.     Blood pressure 118/79, pulse 88, temperature 97.6 F (36.4 C), temperature source Oral, resp. rate 16, height 5\' 6"  (1.676 m), weight 57.153 kg (126 lb), SpO2 99 %.Body mass index is 20.35 kg/(m^2).  General Appearance: Fairly Groomed  Patent attorney::  Fair  Speech:  Clear and Coherent  Volume:  fluctuates  Mood:  Anxious, Depressed and worried  Affect:  Depressed, Labile and anxious worried  Thought Process:  Coherent and Goal Directed  Orientation:  Full (Time, Place, and Person)  Thought Content:  symptoms events worries concerns  Suicidal  Thoughts:  No  Homicidal Thoughts:  No  Memory:  Immediate;   Fair Recent;   Fair Remote;   Fair  Judgement:  Fair  Insight:  Present  Psychomotor Activity:  Restlessness  Concentration:  Fair  Recall:  FiservFair  Fund of Knowledge:Fair  Language: Fair  Akathisia:  No  Handed:  Right  AIMS (if indicated):     Assets:  Desire for Improvement Social Support  ADL's:  Intact  Cognition: WNL  Sleep:  Number of Hours: 3.75     Current Medications: Current Facility-Administered Medications  Medication Dose Route Frequency  Provider Last Rate Last Dose  . acetaminophen (TYLENOL) tablet 650 mg  650 mg Oral Q6H PRN Jomarie LongsSaramma Eappen, MD   650 mg at 06/08/14 2215  . albuterol (PROVENTIL HFA;VENTOLIN HFA) 108 (90 BASE) MCG/ACT inhaler 2 puff  2 puff Inhalation Q4H PRN Beau FannyJohn C Withrow, FNP   2 puff at 06/10/14 1310  . alum & mag hydroxide-simeth (MAALOX/MYLANTA) 200-200-20 MG/5ML suspension 30 mL  30 mL Oral Q4H PRN Jomarie LongsSaramma Eappen, MD      . azithromycin (ZITHROMAX) tablet 250 mg  250 mg Oral Daily Beau FannyJohn C Withrow, FNP   250 mg at 06/10/14 [id number]  . chlordiazePOXIDE (LIBRIUM) capsule 25 mg  25 mg Oral Q6H PRN Jomarie LongsSaramma Eappen, MD      . chlordiazePOXIDE (LIBRIUM) capsule 25 mg  25 mg Oral BH-qamhs Jomarie LongsSaramma Eappen, MD       Followed by  . [START ON 06/12/2014] chlordiazePOXIDE (LIBRIUM) capsule 25 mg  25 mg Oral Daily Saramma Eappen, MD      . dicyclomine (BENTYL) tablet 20 mg  20 mg Oral Q6H PRN Saramma Eappen, MD      . fluconazole (DIFLUCAN) tablet 150 mg  150 mg Oral Daily PRN Beau FannyJohn C Withrow, FNP      . guaiFENesin (MUCINEX) 12 hr tablet 1,200 mg  1,200 mg Oral BID Beau FannyJohn C Withrow, FNP   1,200 mg at 06/10/14 1645  . hydrOXYzine (ATARAX/VISTARIL) tablet 50 mg  50 mg Oral Q6H PRN Beau FannyJohn C Withrow, FNP   50 mg at 06/10/14 1440  . loperamide (IMODIUM) capsule 2-4 mg  2-4 mg Oral PRN Jomarie LongsSaramma Eappen, MD      . magnesium hydroxide (MILK OF MAGNESIA) suspension 30 mL  30 mL Oral Daily PRN Jomarie LongsSaramma Eappen, MD      . menthol-cetylpyridinium (CEPACOL) lozenge 3 mg  1 lozenge Oral PRN Rachael FeeIrving A Quentin Strebel, MD      . methocarbamol (ROBAXIN) tablet 500 mg  500 mg Oral Q8H PRN Jomarie LongsSaramma Eappen, MD      . multivitamin with minerals tablet 1 tablet  1 tablet Oral Daily Jomarie LongsSaramma Eappen, MD   1 tablet at 06/10/14 0733  . naproxen (NAPROSYN) tablet 500 mg  500 mg Oral BID PRN Jomarie LongsSaramma Eappen, MD   500 mg at 06/10/14 0629  . ondansetron (ZOFRAN-ODT) disintegrating tablet 4 mg  4 mg Oral Q6H PRN Saramma Eappen, MD      . sodium chloride (OCEAN) 0.65 % nasal spray 2  spray  2 spray Each Nare PRN Beau FannyJohn C Withrow, FNP   2 spray at 06/09/14 1449  . thiamine (B-1) injection 100 mg  100 mg Intramuscular Once Jomarie LongsSaramma Eappen, MD   100 mg at 06/08/14 2000  . thiamine (VITAMIN B-1) tablet 100 mg  100 mg Oral Daily Jomarie LongsSaramma Eappen, MD   100 mg at 06/10/14 0733  . venlafaxine XR (EFFEXOR-XR) 24 hr capsule 37.5 mg  37.5 mg  Oral Q breakfast Beau Fanny, FNP   37.5 mg at 06/10/14 4098    Lab Results:  Results for orders placed or performed during the hospital encounter of 06/08/14 (from the past 48 hour(s))  Urinalysis, Routine w reflex microscopic     Status: Abnormal   Collection Time: 06/10/14  6:16 AM  Result Value Ref Range   Color, Urine YELLOW YELLOW   APPearance CLOUDY (A) CLEAR   Specific Gravity, Urine 1.010 1.005 - 1.030   pH 6.0 5.0 - 8.0   Glucose, UA NEGATIVE NEGATIVE mg/dL   Hgb urine dipstick MODERATE (A) NEGATIVE   Bilirubin Urine NEGATIVE NEGATIVE   Ketones, ur NEGATIVE NEGATIVE mg/dL   Protein, ur NEGATIVE NEGATIVE mg/dL   Urobilinogen, UA 0.2 0.0 - 1.0 mg/dL   Nitrite POSITIVE (A) NEGATIVE   Leukocytes, UA MODERATE (A) NEGATIVE    Comment: Performed at [facility]  Urine microscopic-add on     Status: Abnormal   Collection Time: 06/10/14  6:16 AM  Result Value Ref Range   Squamous Epithelial / LPF RARE RARE   WBC, UA TOO NUMEROUS TO COUNT <3 WBC/hpf   RBC / HPF 3-6 <3 RBC/hpf   Bacteria, UA MANY (A) RARE   Urine-Other MUCOUS PRESENT     Comment: Performed at [facility]  Hepatitis B surface antigen     Status: None   Collection Time: 06/10/14  6:25 AM  Result Value Ref Range   Hepatitis B Surface Ag NEGATIVE NEGATIVE    Comment: Performed at [facility]  Hepatitis C antibody     Status: None   Collection Time: 06/10/14  6:25 AM  Result Value Ref Range   HCV Ab NEGATIVE NEGATIVE    Comment: Performed at [facility]  HIV antibody     Status: None   Collection Time: 06/10/14   6:25 AM  Result Value Ref Range   HIV Screen 4th Generation wRfx Non Reactive Non Reactive    Comment: (NOTE) Performed At: [facility] [address] [place], [place] [id number] Mila Homer MD FA:[id number] Performed at [facility]   RPR     Status: None   Collection Time: 06/10/14  6:25 AM  Result Value Ref Range   RPR Ser Ql Non Reactive Non Reactive    Comment: (NOTE) Performed At: [facility] [address] [place], [place] [id number] Mila Homer MD [id number] Performed at [facility]   TSH     Status: None   Collection Time: 06/10/14  6:25 AM  Result Value Ref Range   TSH 2.082 0.350 - 4.500 uIU/mL    Comment: Performed at [facility]  T4, free     Status: None   Collection Time: 06/10/14  6:25 AM  Result Value Ref Range   Free T4 1.14 0.80 - 1.80 ng/dL    Comment: Performed at [facility]    Physical Findings: AIMS: Facial and Oral Movements Muscles of Facial Expression: None, normal Lips and Perioral Area: None, normal Jaw: None, normal Tongue: None, normal,Extremity Movements Upper (arms, wrists, hands, fingers): None, normal Lower (legs, knees, ankles, toes): None, normal, Trunk Movements Neck, shoulders, hips: None, normal, Overall Severity Severity of abnormal movements (highest score from questions above): None, normal Incapacitation due to abnormal movements: None, normal Patient's awareness of abnormal movements (rate only patient's report): No Awareness, Dental Status Current problems with teeth and/or dentures?: No Does patient usually  wear dentures?: No  CIWA:  CIWA-Ar Total: 1 COWS:  COWS Total Score: 2  Treatment Plan Summary: Daily contact with patient to assess and evaluate symptoms and progress in treatment and Medication management Supportive approach/coping skills/relapse prevention Polysubstance Dependence: will pursue the Librium detox to  address the benzodiazepine dependence/withdrawal Will work a relapse prevention plan Will continue to work with the Effexor and increase the dose to 75 mg daily ( she has used it before) Will support her decision of getting a medical withdrawal from school.  Will explore the possibility of a residential treatment program vs. PHP,  Will work with CBT/mindfulness  Medical Decision Making:  Review of Psycho-Social Stressors (1), Review or order clinical lab tests (1) and Review of Medication Regimen & Side Effects (2)     Rahiem Schellinger A 06/10/2014, 6:11 PM

## 2014-06-10 NOTE — Progress Notes (Signed)
Report given to Drenda FreezeBeverly K. RN

## 2014-06-10 NOTE — [facility] Suicide Risk Assessment (Signed)
[facility] INPATIENT:  Family/Significant Other Suicide Prevention Education  Suicide Prevention Education:  Education Completed; Mr. [name]-pt's father (pt's [phone]) been identified by the patient as the family member/significant other with whom the patient will be residing, and identified as the person(s) who will aid the patient in the event of a mental health crisis (suicidal ideations/suicide attempt).  With written consent from the patient, the family member/significant other has been provided the following suicide prevention education, prior to the and/or following the discharge of the patient.  The suicide prevention education provided includes the following:  Suicide risk factors  Suicide prevention and interventions  National Suicide Hotline telephone number  [facility] assessment telephone number  [facility] Emergency Assistance 911  [facility] and/or Residential Mobile Crisis Unit telephone number  Request made of family/significant other to:  Remove weapons (e.g., guns, rifles, knives), all items previously/currently identified as safety concern.    Remove drugs/medications (over-the-counter, prescriptions, illicit drugs), all items previously/currently identified as a safety concern.  The family member/significant other verbalizes understanding of the suicide prevention education information provided.  The family member/significant other agrees to remove the items of safety concern listed above.  Smart, [name] LCSWA 06/10/2014, 2:48 PM

## 2014-06-10 NOTE — [facility] Group Notes (Signed)
[facility] Group Notes:  (Nursing/MHT/Case Management/Adjunct)  Date:  06/10/2014  Time:  0900am  Type of Therapy:  Nurse Education  Participation Level:  Active  Participation Quality:  Appropriate and Attentive  Affect:  Appropriate  Cognitive:  Alert and Appropriate  Insight:  Appropriate and Good  Engagement in Group:  Engaged  Modes of Intervention:  Discussion, Education and Support  Summary of Progress/Problems: Patient attended group, remained engaged, and responded appropriately when prompted.  Lendell CapriceGuthrie, Dakai Braithwaite A 06/10/2014, 10:07 AM

## 2014-06-10 NOTE — Progress Notes (Signed)
Recreation Therapy Notes  Animal-Assisted Activity (AAA) Program Checklist/Progress Notes Patient Eligibility Criteria Checklist & Daily Group note for Rec Tx Intervention  Date: 04.05.2016 Time: 2:45pm [address]   AAA/T Program Assumption of Risk Form signed by Patient/ or Parent Legal Guardian yes  Patient is free of allergies or sever asthma yes  Patient reports no fear of animals yes  Patient reports no history of cruelty to animals yes  Patient understands his/her participation is voluntary yes  Patient washes hands before animal contact yes  Patient washes hands after animal contact yes  Behavioral Response: Appropriate   Education: Hand Washing, Appropriate Animal Interaction   Education Outcome: Acknowledges education.   Clinical Observations/Feedback: Patient actively engaged in session, petting therapy dog appropriately and interacting with peers appropriately during session.   Marykay Lexenise L Chanse Kagel, LRT/CTRS  Monzerat Handler L 06/10/2014 5:11 PM

## 2014-06-10 NOTE — [facility] Group Notes (Signed)
Adult Psychoeducational Group Note  Date:  06/10/2014 Time:  9:44 PM  Group Topic/Focus:  Wrap-Up Group:   The focus of this group is to help patients review their daily goal of treatment and discuss progress on daily workbooks.  Participation Level:  Active  Participation Quality:  Appropriate  Affect:  Appropriate  Cognitive:  Appropriate  Insight: Appropriate  Engagement in Group:  Engaged  Modes of Intervention:  Discussion  Additional Comments:  [name] said her day was super awesome.  She stated she ate full meal for the first since she been here.  She also stated she had a lot of visits and her goal was to come up with a discharge plan.  Caroll RancherLindsay, [name] A 06/10/2014, 9:44 PM

## 2014-06-10 NOTE — Plan of Care (Signed)
Problem: Consults Goal: Depression Patient Education See Patient Education Module for education specifics.  Outcome: Completed/Met Date Met:  06/10/14 Nurse discussed coping skills/depression with patient.

## 2014-06-10 NOTE — Progress Notes (Addendum)
D: Patient is alert and oriented. Pt's mood and affect is pleasant upon interaction and blunted. Pt denies SI/HI and AVH. Pt reports her throat is feeling better and she was able to eat breakfast, pt expresses much relief, throat observed to be swollen and red. Pt complains of shortness of breath this morning with relief from PRN medication. Pt rates her depression and hopelessness 5/10 and anxiety 3/10. Pt is attending unit groups. A: Active listening by RN. Encouragement/Support provided to pt. PRN medication administered for shortness of breath per providers orders (See MAR). Scheduled medications administered per providers orders (See MAR). 15 minute checks continued per protocol for patient safety.  R: Patient cooperative and receptive to nursing interventions. Pt remains safe.

## 2014-06-10 NOTE — B[facility] Counselor (Signed)
Adult Comprehensive Assessment  Patient ID: [name], female   DOB: Jul 11, 1990, 24 y.o.   MRN: [id number]  Information Source:  Patient   Current Stressors:  Educational / Learning stressors: student-senior in college. 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 acid-2 to 3 times per week; marijuana daily for past few years. no alcohol use.  Living/Environment/Situation:  Living Arrangements: Non-relatives/Friends Living conditions (as described by patient or guardian): lives near campus How long has patient lived in current situation?: few years-during school breaks, pt [name] with family What is atmosphere in current home: Supportive  Family History:  Marital status: Single Does patient have children?: No  Childhood History:  By whom was/is the patient raised?: Both parents Additional childhood history information: pt reports that parents have been married and are still married. "good childhood." no abuse reported Description of patient's relationship with caregiver when they were a child: close to parents Patient's description of current relationship with people who raised him/her: tumultous relationship during teenage years "that only got worse up till now." pt also reports that her father is "probably an alcoholic."  Does patient have siblings?: Yes Number of Siblings: 1 Description of patient's current relationship with siblings: older sister lives in [name]we are really close." Did patient suffer any  verbal/emotional/physical/sexual abuse as a child?: No Did patient suffer from severe childhood neglect?: No Has patient ever been sexually abused/assaulted/raped as an adolescent or adult?: No Was the patient ever a victim of a crime or a disaster?: No Witnessed domestic violence?: No Has patient been effected by domestic violence as an adult?: No  Education:  Currently a Consulting civil engineer?: Yes If yes, how has current illness impacted academic performance: poor grades/"I'm probably going to fail." Contact person: n/a Learning disability?: No  Employment/Work Situation:   Employment situation: Surveyor, minerals job has been impacted by current illness: No (n/a) What is the longest time patient has a held a job?: one year Where was the patient employed at that time?: customer service Has patient ever been in the [employer]?: No Has patient ever served in Buyer, retail?: No  [name] Resources:   Surveyor, quantity resources: Support from parents / caregiver, Media planner Does patient have a Lawyer or guardian?: No  Alcohol/Substance Abuse:   If attempted suicide, did drugs/alcohol play a role in this?: No Alcohol/Substance Abuse Treatment Hx: Denies past history Has alcohol/substance abuse ever caused legal problems?: No  Social Support System:   Patient's Community Support System: Fair Museum/gallery exhibitions officer System: friends at school and at home-most use drugs as well Type of faith/religion: n/a  How does patient's faith help to cope with current illness?: n/a   Leisure/Recreation:   Leisure and Hobbies: art, Estate agent  Strengths/Needs:   What things does the patient do well?: art, friendly,  In what areas does patient struggle / problems for patient: coping with stress; substance abuse, insight  Discharge Plan:   Does patient have access to transportation?: Yes Will patient be returning to same living situation after discharge?: Yes (pt plans to go back to school at  d/c) Currently receiving community mental health services: Yes (From Whom) ([place]annon [name] college-counselor.  [name]-PCP) If no, would patient like referral for services when discharged?: Yes (What county?) Medical sales representative([name]) Does patient have [name] barriers related to discharge medications?: No (private insurance)  Summary/Recommendations:    Pt is 24 year old female [name] [facility] due to SI, depression, marijuana use, hallucinogen abuse, benzo abuse, and for medication stabilization. Pt currently denies SI/HI/AVH. Recommendations for pt include: crisis stabilization, therapeutic milieu, encourage group attendance and participation, librium taper for withdrawals, and medication management for mood stabilization. Pt reports no prior hospitalizations and no prior suicide attempts. Pt hoping to be referred to partial hospitalization program and is not open to inpatient treatment at this time. She plans to return to school at this time. CSW assessing.   Smart, BerlinHeather LCSWA 06/10/2014

## 2014-06-10 NOTE — Clinical Social Work Note (Signed)
[name] requesting that she meet with pt to discuss program and possibly schedule assessment.  The Sherwin-WilliamsHeather Smart, LCSWA 06/10/2014 4:09 PM

## 2014-06-11 MED ORDER — HYDROXYZINE HCL 25 MG PO TABS
25.0000 mg | ORAL_TABLET | Freq: Four times a day (QID) | ORAL | Status: DC | PRN
  Administered 2014-06-11: 25 mg via ORAL
  Filled 2014-06-11: qty 1

## 2014-06-11 MED ORDER — VENLAFAXINE HCL ER 75 MG PO CP24
75.0000 mg | ORAL_CAPSULE | Freq: Every day | ORAL | Status: DC
  Administered 2014-06-12 – 2014-06-13 (×2): 75 mg via ORAL
  Filled 2014-06-11 (×3): qty 1
  Filled 2014-06-11: qty 3

## 2014-06-11 MED ORDER — TRAZODONE HCL 50 MG PO TABS
50.0000 mg | ORAL_TABLET | Freq: Every evening | ORAL | Status: DC | PRN
  Administered 2014-06-11 – 2014-06-12 (×2): 50 mg via ORAL
  Filled 2014-06-11 (×2): qty 1
  Filled 2014-06-11: qty 6
  Filled 2014-06-11 (×2): qty 1
  Filled 2014-06-11: qty 6
  Filled 2014-06-11 (×2): qty 1

## 2014-06-11 NOTE — [facility] Group Notes (Signed)
[facility]   06/11/2014 10:25 AM  Participation Quality:  Appropriate   Mood/Affect:  Anxious  Depression Rating:  4  Anxiety Rating:  7 "high for me."   Thoughts of Suicide:  No Will you contract for safety?   NA  Current AVH:  No  Plan for Discharge/Comments:  Pt reports that she had not [facility] and feels that she does not have any information about her plan at d/c. Pt reports feeling anxious and reports no withdrawals today. (After group[facility] and scheduled first appt for assessment for Monday April 11th at 8:30AM). Pt worried about being homeless at d/c, as she cannot return to school and [place]. CSW assessing. Pt gave CSW permission to contact parents to discuss living arrangements and boundaries.   Transportation Means: parents   Supports: parents   Smart, OncologistHeather LCSWA

## 2014-06-11 NOTE — Progress Notes (Signed)
[facility]  06/11/2014 4:58 PM Shannon [name]  MRN:  [id number] Subjective:  Shannon [name] is having a hard time with anxiety, worry. She states she could not sleep well last night worrying about where to go from here, finding a place, how to afford it, her parents response to what is going on with her. States that she cant go back to the dorm as she is going to be back using. [facility] but will need a place to stay. Admits she gets very overwhelmed starts having panic attacks. States she is having a hard time depending on other people doing things for her.  Principal Problem: Polysubstance dependence Diagnosis:   Patient Active Problem List   Diagnosis Date Noted  . Acute bronchitis, bacterial [J00] 06/09/2014  . Polysubstance dependence [F19.20]   . 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 Substance induced mood disorder [F19.94] 06/08/2014   Total Time spent with patient: 30 minutes   Past Medical History:  Past Medical History  Diagnosis Date  . Anxiety   . ADHD (attention deficit hyperactivity disorder)   . Depression     Past Surgical History  Procedure Laterality Date  . Wisdom tooth extraction     Family History: History reviewed. No pertinent family history. Social History:  History  Alcohol Use  . Yes     History  Drug Use Not on file    History   Social History  . Marital Status: Single    Spouse Name: N/A  . Number of Children: N/A  . Years of Education: N/A   Social History Main Topics  . Smoking status: Current Every Day Smoker  . Smokeless tobacco: Not on file  . Alcohol Use: Yes  . Drug Use: Not on file  . Sexual Activity: Not on file   Other Topics Concern  . None   Social History Narrative   Additional History:    Sleep: Poor  Appetite:  Fair   Assessment:   Musculoskeletal: Strength & Muscle Tone: within normal limits Gait & Station: normal Patient leans:  N/A   Psychiatric Specialty Exam: Physical Exam  Review of Systems  Constitutional: Negative.   HENT: Negative.   Eyes: Negative.   Respiratory: Negative.   Cardiovascular: Negative.   Gastrointestinal: Negative.   Genitourinary: Negative.   Musculoskeletal: Negative.   Skin: Negative.   Neurological: Negative.   Endo/Heme/Allergies: Negative.   Psychiatric/Behavioral: Positive for depression and substance abuse. The patient is nervous/anxious and has insomnia.     Blood pressure 125/86, pulse 115, temperature 98.8 F (37.1 C), temperature source Oral, resp. rate 16, height 5\' 6"  (1.676 m), weight 57.153 kg (126 lb), SpO2 99 %.Body mass index is 20.35 kg/(m^2).  General Appearance: Fairly Groomed  Patent attorneyye Contact::  Fair  Speech:  Clear and Coherent  Volume:  flucutuates  Mood:  Anxious, Depressed and Dysphoric  Affect:  Labile and Tearful  Thought Process:  Coherent and Goal Directed  Orientation:  Full (Time, Place, and Person)  Thought Content:  symtpoms events worries concerns  Suicidal Thoughts:  No  Homicidal Thoughts:  No  Memory:  Immediate;   Fair Recent;   Fair Remote;   Fair  Judgement:  Fair  Insight:  Present  Psychomotor Activity:  Restlessness  Concentration:  Fair  Recall:  FiservFair  Fund of Knowledge:Fair  Language: Fair  Akathisia:  No  Handed:  Right  AIMS (if indicated):  Assets:  Desire for Improvement  ADL's:  Intact  Cognition: WNL  Sleep:  Number of Hours: 4.75     Current Medications: Current Facility-Administered Medications  Medication Dose Route Frequency Provider Last Rate Last Dose  . acetaminophen (TYLENOL) tablet 650 mg  650 mg Oral Q6H PRN Jomarie Longs, MD   650 mg at 06/08/14 2215  . albuterol (PROVENTIL HFA;VENTOLIN HFA) 108 (90 BASE) MCG/ACT inhaler 2 puff  2 puff Inhalation Q4H PRN Beau Fanny, FNP   2 puff at 06/11/14 [phone]  . alum & mag hydroxide-simeth (MAALOX/MYLANTA) 200-200-20 MG/5ML suspension 30 mL  30 mL Oral Q4H  PRN Jomarie Longs, MD      . azithromycin (ZITHROMAX) tablet 250 mg  250 mg Oral Daily Beau Fanny, FNP   250 mg at 06/11/14 0917  . chlordiazePOXIDE (LIBRIUM) capsule 25 mg  25 mg Oral Q6H PRN Jomarie Longs, MD   25 mg at 06/11/14 0118  . [START ON 06/12/2014] chlordiazePOXIDE (LIBRIUM) capsule 25 mg  25 mg Oral Daily Saramma Eappen, MD      . dicyclomine (BENTYL) tablet 20 mg  20 mg Oral Q6H PRN Saramma Eappen, MD      . fluconazole (DIFLUCAN) tablet 150 mg  150 mg Oral Daily PRN Beau Fanny, FNP      . guaiFENesin (MUCINEX) 12 hr tablet 1,200 mg  1,200 mg Oral BID Beau Fanny, FNP   1,200 mg at 06/11/14 0917  . hydrOXYzine (ATARAX/VISTARIL) tablet 50 mg  50 mg Oral Q6H PRN Beau Fanny, FNP   50 mg at 06/11/14 9702  . loperamide (IMODIUM) capsule 2-4 mg  2-4 mg Oral PRN Jomarie Longs, MD      . magnesium hydroxide (MILK OF MAGNESIA) suspension 30 mL  30 mL Oral Daily PRN Jomarie Longs, MD      . menthol-cetylpyridinium (CEPACOL) lozenge 3 mg  1 lozenge Oral PRN Rachael Fee, MD      . methocarbamol (ROBAXIN) tablet 500 mg  500 mg Oral Q8H PRN Jomarie Longs, MD   500 mg at 06/11/14 6378  . multivitamin with minerals tablet 1 tablet  1 tablet Oral Daily Jomarie Longs, MD   1 tablet at 06/11/14 0918  . naproxen (NAPROSYN) tablet 500 mg  500 mg Oral BID PRN Jomarie Longs, MD   500 mg at 06/10/14 2137  . ondansetron (ZOFRAN-ODT) disintegrating tablet 4 mg  4 mg Oral Q6H PRN Saramma Eappen, MD      . sodium chloride (OCEAN) 0.65 % nasal spray 2 spray  2 spray Each Nare PRN Beau Fanny, FNP   2 spray at 06/09/14 1449  . thiamine (B-1) injection 100 mg  100 mg Intramuscular Once Jomarie Longs, MD   100 mg at 06/08/14 2000  . thiamine (VITAMIN B-1) tablet 100 mg  100 mg Oral Daily Jomarie Longs, MD   100 mg at 06/11/14 0917  . venlafaxine XR (EFFEXOR-XR) 24 hr capsule 37.5 mg  37.5 mg Oral Q breakfast Beau Fanny, FNP   37.5 mg at 06/11/14 5885    Lab Results:  Results for  orders placed or performed during the hospital encounter of 06/08/14 (from the past 48 hour(s))  Urinalysis, Routine w reflex microscopic     Status: Abnormal   Collection Time: 06/10/14  6:16 AM  Result Value Ref Range   Color, Urine YELLOW YELLOW   APPearance CLOUDY (A) CLEAR   Specific Gravity, Urine 1.010 1.005 - 1.030   pH  6.0 5.0 - 8.0   Glucose, UA NEGATIVE NEGATIVE mg/dL   Hgb urine dipstick MODERATE (A) NEGATIVE   Bilirubin Urine NEGATIVE NEGATIVE   Ketones, ur NEGATIVE NEGATIVE mg/dL   Protein, ur NEGATIVE NEGATIVE mg/dL   Urobilinogen, UA 0.2 0.0 - 1.0 mg/dL   Nitrite POSITIVE (A) NEGATIVE   Leukocytes, UA MODERATE (A) NEGATIVE    Comment: Performed at [facility]  Urine microscopic-add on     Status: Abnormal   Collection Time: 06/10/14  6:16 AM  Result Value Ref Range   Squamous Epithelial / LPF RARE RARE   WBC, UA TOO NUMEROUS TO COUNT <3 WBC/hpf   RBC / HPF 3-6 <3 RBC/hpf   Bacteria, UA MANY (A) RARE   Urine-Other MUCOUS PRESENT     Comment: Performed at [facility]  Hepatitis B surface antigen     Status: None   Collection Time: 06/10/14  6:25 AM  Result Value Ref Range   Hepatitis B Surface Ag NEGATIVE NEGATIVE    Comment: Performed at [facility]  Hepatitis C antibody     Status: None   Collection Time: 06/10/14  6:25 AM  Result Value Ref Range   HCV Ab NEGATIVE NEGATIVE    Comment: Performed at [facility]  HIV antibody     Status: None   Collection Time: 06/10/14  6:25 AM  Result Value Ref Range   HIV Screen 4th Generation wRfx Non Reactive Non Reactive    Comment: (NOTE) Performed At: [facility] [address] [place], [place] [id number] Mila Homer MD WU:[id number] Performed at [facility]   RPR     Status: None   Collection Time: 06/10/14  6:25 AM  Result Value Ref Range   RPR Ser Ql Non Reactive Non Reactive    Comment: (NOTE) Performed At: [facility] [address] [place], [place] [id number] Mila Homer MD VH:[id number] Performed at [facility]   TSH     Status: None   Collection Time: 06/10/14  6:25 AM  Result Value Ref Range   TSH 2.082 0.350 - 4.500 uIU/mL    Comment: Performed at [facility]  T4, free     Status: None   Collection Time: 06/10/14  6:25 AM  Result Value Ref Range   Free T4 1.14 0.80 - 1.80 ng/dL    Comment: Performed at [facility]    Physical Findings: AIMS: Facial and Oral Movements Muscles of Facial Expression: None, normal Lips and Perioral Area: None, normal Jaw: None, normal Tongue: None, normal,Extremity Movements Upper (arms, wrists, hands, fingers): None, normal Lower (legs, knees, ankles, toes): None, normal, Trunk Movements Neck, shoulders, hips: None, normal, Overall Severity Severity of abnormal movements (highest score from questions above): None, normal Incapacitation due to abnormal movements: None, normal Patient's awareness of abnormal movements (rate only patient's report): No Awareness, Dental Status Current problems with teeth and/or dentures?: No Does patient usually wear dentures?: No  CIWA:  CIWA-Ar Total: 2 COWS:  COWS Total Score: 2  Treatment Plan Summary: Daily contact with patient to assess and evaluate symptoms and progress in treatment and Medication management Supportive approach/coping skills/relapse prevention Polysubstance Dependence: complete the Librium Detox protocol, continue to work a relapse prevention plan Major depression: will increase the Effexor to 75 mg Acute anxiety/panic; will use CBT, mindfulness, problem solving. Encourage to stay on the present moment go one day at a time change the catastrophic  thinking  MSW to communicate with family and share patients concerns  Medical Decision Making:  Review of Psycho-Social Stressors (1), Review of Medication Regimen & Side Effects (2) and Review of  New Medication or Change in Dosage (2)     Inita Uram A 06/11/2014, 4:58 PM

## 2014-06-11 NOTE — Clinical Social Work Note (Signed)
CSW spoke with pt's parents by phone regarding pt discharge/aftercare plan. They are agreeable and supportive of pt attending partial hospitalization program (first appt on Monday) and discussed options for housing. Pt will be provided with [facility] house list and will be encouraged to begin calling. Parents are currently looking for rooms for rent. Pt also working with a friend to secure potential housing. Pt's parents will visit her this evening to touch base regarding housing. MD notified of update.  The Sherwin-WilliamsHeather Smart, LCSWA 06/11/2014 1:00 PM

## 2014-06-11 NOTE — [facility] Group Notes (Signed)
[facility] LCSW Group Therapy  06/11/2014 3:23 PM  Type of Therapy:  Group Therapy  Participation Level:  Active  Participation Quality:  Attentive  Affect:  Appropriate  Cognitive:  Alert and Oriented  Insight:  Improving  Engagement in Therapy:  Engaged  Modes of Intervention:  Confrontation, Discussion, Education, Exploration, Problem-solving, Rapport Building, Socialization and Support  Summary of Progress/Problems: Emotion Regulation: This group focused on both positive and negative emotion identification and allowed group members to process ways to identify feelings, regulate negative emotions, and find healthy ways to manage internal/external emotions. Group members were asked to reflect on a time when their reaction to an emotion led to a negative outcome and explored how alternative responses using emotion regulation would have benefited them. Group members were also asked to discuss a time when emotion regulation was utilized when a negative emotion was experienced. Shannon [name] was attentive and engaged during today's processing group. She shared that she struggles with anxiety and tends to use drugs in order to reduce anxiety. Shannon [name] acknowledged that this was a problem but continues to defend her usage of marijuana. She is redirectable by CSW when necessary and stated that she feels that the partial hospitalization program will help her learn more effective coping skills to deal with anxiety.   Smart, Rollo Farquhar LCSWA  06/11/2014, 3:23 PM

## 2014-06-11 NOTE — Progress Notes (Signed)
Patient ID: Shannon [name], female   DOB: May 29, 1990, 24 y.o.   MRN: [id number] D: Approached pt in her room crying. Pt reports her parents visited and it was not productive. Pt reports her parents were not supportive since she is dropping out of school. Pt reports she needs her parents financially to continue to live here in [place] and continue OP. Pt reports her father wants her back in [place] and does not trust her to be. Pt requested anxiety medication to calm her nerves.  A: Met with pt 1:1. Able to talk to pt about using coping skills in situations like this and not rely mostly on medication. Medications administered as prescribed for anxiety and sleep. Writer encouraged pt to discuss feelings. Pt encouraged to come to staff with any question or concerns.   R: Patient remains safe. Pt reports feeling calm after taking to Probation officer. Pt complaint with medications and denies any adverse reaction.

## 2014-06-11 NOTE — Progress Notes (Addendum)
Shannon [name] has been pretty uncomfortable today. She is sad, depressed and has a flat affect. She completed her morning assessment and on it she writes she denies SI today and she rates her depression, hopelessness and anxiety  "5/5/6", respectively.   [name] is engaged in her recovery as evidenced by her presence and participation in her scheduled groups. She is processing her feelings ( physically) and how shes going to use this as a motivator to stay off drugs.    [name] is safe and this nurse offered pos reenforcement of new coping skills practiced by pt.

## 2014-06-11 NOTE — Progress Notes (Signed)
Pt reports she is doing much better.  She was placed on an antibiotic for her sore throat, and has prn meds available for pain and discomfort which she is utilizing.  Pt denies SI/HI/AV tonight.  She is frustrated with school, but is not settled on what she is going to do.  She says she has been up and going to groups.  Pt voices no needs or concerns at this time.  Pt is polite and cooperative with staff.  Support and encouragement offered.  Safety maintained with q15 minute checks.

## 2014-06-11 NOTE — Progress Notes (Signed)
Recreation Therapy Notes  Date: 04.06.2016 Time: 9:30am Location: [address] Group Room   Group Topic: Stress Management  Goal Area(s) Addresses:  Patient will actively participate in stress management techniques presented during session.   Behavioral Response: Did not attend.   Marykay Lexenise L Caliana Spires, LRT/CTRS  Milissa Fesperman L 06/11/2014 2:12 PM

## 2014-06-12 LAB — HIV ANTIBODY (ROUTINE TESTING W REFLEX): HIV SCREEN 4TH GENERATION: NONREACTIVE

## 2014-06-12 LAB — GC/CHLAMYDIA PROBE AMP (~~LOC~~) NOT AT ARMC
CHLAMYDIA, DNA PROBE: NEGATIVE
Neisseria Gonorrhea: NEGATIVE

## 2014-06-12 NOTE — Clinical Social Work Note (Signed)
CSW met with patient to discuss her discharge plans. Patient reports that she plans to stay with a friend and friend's family over the weekend while her father is in town to assist her in apartment searching. CSW spoke with father Mr. Russman [phone] to update him and answer additional questions.  Tilden Fossa, MSW, [place] Worker [facility] [phone]

## 2014-06-12 NOTE — Plan of Care (Signed)
Problem: Ineffective individual coping Goal: STG:Pt. will utilize relaxation techniques to reduce stress STG: Patient will utilize relaxation techniques to reduce stress levels  Outcome: Progressing Pt quite upset about parents visiting. Having panic attack but able to talk to writer about her feelings and that helped calm down.

## 2014-06-12 NOTE — [facility] Group Notes (Signed)
[facility] LCSW Group Therapy 06/12/2014 1:15 PM Type of Therapy: Group Therapy Participation Level: Active  Participation Quality: Attentive, Sharing and Supportive  Affect: Appropriate  Cognitive: Alert and Oriented  Insight: Developing/Improving and Engaged  Engagement in Therapy: Developing/Improving and Engaged  Modes of Intervention: Activity, Clarification, Confrontation, Discussion, Education, Exploration, Limit-setting, Orientation, Problem-solving, Rapport Building, Reality Testing, Socialization and Support  Summary of Progress/Problems: Patient was attentive and engaged with speaker from [employer]. Patient was attentive to speaker while they shared their story of dealing with mental health and overcoming it. Patient expressed interest in their programs and services and received information on their agency. Patient processed ways they can relate to the speaker.   Amanii Snethen, MSW, LCSWA Clinical Social Worker  [facility] [phone]   

## 2014-06-12 NOTE — Progress Notes (Signed)
Patient ID: Shannon [name], female   DOB: August 17, 1990, 24 y.o.   MRN: [id number] D: Patient denies SI/HI and auditory and visual hallucinations.Patient states that she is eating better and that she is sleeping well. Reports her throat is hurting. Rates depression 5 on a 1 to 10. Rating hopelessness 3 on 1 to 10 scale. Set goal to find a place to live and find outpatient tx.  A: Patient given emotional support from RN. Patient given medications per MD orders. Patient encouraged to attend groups and unit activities. Patient encouraged to come to staff with any questions or concerns.  R: Patient remains cooperative and appropriate. Will continue to monitor patient for safety.Patient to call father today for help meeting goal.

## 2014-06-12 NOTE — Progress Notes (Signed)
D:Patient in the hallway on approach.  Patient states she is happy because she will be discharging tomorrow.  Patient states she has her discharge plan together and states she will be going to outpatient therapy for 3 weeks. Patient was in a pleasant mood and states she has been in contact with her family and friends today. Patient   Patient denies SI/HI and denies AVH.   A: Staff to monitor Q 15 mins for safety.  Encouragement and support offered.  Scheduled medications administered per orders.  Naproxen administered prn for a sore throat. R: Patient remains safe on the unit.  Patient attended group tonight.  Patient visible on the unit and interacting with peers.  Patient taking administered medications .

## 2014-06-12 NOTE — [facility] Group Notes (Signed)
[facility] Group Notes:  (Nursing/MHT/Case Management/Adjunct)  Date:  06/12/2014  Time:  0900  Type of Therapy:  Nurse Education  Participation Level:  Did Not Attend  Participation Quality:    Affect:    Cognitive:    Insight:    Engagement in Group:    Modes of Intervention:    Summary of Progress/Problems:  Shannon [name], Shannon [name] 06/12/2014, 9:29 AM

## 2014-06-12 NOTE — Progress Notes (Signed)
Pt attended and was engaged in karaoke. 

## 2014-06-12 NOTE — Progress Notes (Signed)
[facility] MD Progress Note  06/12/2014 4:13 PM Shannon [name]  MRN:  [id number] Subjective:  Shannon [name] met with her parents yesterday. States it went as she thought it was going to go. There was " a lot of yelling." states they are still holding to events that happened in the past. She states she has a hard time with her father as he does not express his feelings too well. They are willing to help her find a place. She is having a hard time thinking that she will have to let go of smoking pot. States that marijuana has really help her deal with the anxiety, sees nothing wrong with it. 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 Acute bronchitis, bacterial [J00] 06/09/2014  . Polysubstance dependence [F19.20]   . 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 Substance induced mood disorder [F19.94] 06/08/2014   Total Time spent with patient: 30 minutes   Past Medical History:  Past Medical History  Diagnosis Date  . Anxiety   . ADHD (attention deficit hyperactivity disorder)   . Depression     Past Surgical History  Procedure Laterality Date  . Wisdom tooth extraction     Family History: History reviewed. No pertinent family history. Social History:  History  Alcohol Use  . Yes     History  Drug Use Not on file    History   Social History  . Marital Status: Single    Spouse Name: N/A  . Number of Children: N/A  . Years of Education: N/A   Social History Main Topics  . Smoking status: Current Every Day Smoker  . Smokeless tobacco: Not on file  . Alcohol Use: Yes  . Drug Use: Not on file  . Sexual Activity: Not on file   Other Topics Concern  . None   Social History Narrative   Additional History:    Sleep:  Fair  Appetite:  Fair   Assessment:   Musculoskeletal: Strength & Muscle Tone: within normal limits Gait & Station: normal Patient leans: N/A   Psychiatric Specialty Exam: Physical Exam  Review of Systems  Constitutional: Negative.   HENT: Negative.   Eyes: Negative.   Respiratory: Negative.   Cardiovascular: Negative.   Gastrointestinal: Negative.   Genitourinary: Negative.   Musculoskeletal: Negative.   Skin: Negative.   Neurological: Negative.   Endo/Heme/Allergies: Negative.   Psychiatric/Behavioral: Positive for substance abuse. The patient is nervous/anxious.     Blood pressure 107/65, pulse 70, temperature 98.2 F (36.8 C), temperature source Oral, resp. rate 16, height _0  (1.676 m), weight 57.153 kg (126 lb), SpO2 99 %.Body mass index is 20.35 kg/(m^2).  General Appearance: Fairly Groomed  Engineer, water::  Fair  Speech:  Clear and Coherent  Volume:  normal  Mood:  Anxious and worried  Affect:  Appropriate, anxious worried  Thought Process:  Coherent and Goal Directed  Orientation:  Full (Time, Place, and Person)  Thought Content:  symptoms events worries concerns  Suicidal Thoughts:  No  Homicidal Thoughts:  No  Memory:  Immediate;   Fair Recent;   Fair Remote;   Fair  Judgement:  Fair  Insight:  Present  Psychomotor Activity:  Normal  Concentration:  Fair  Recall:  Fair  Fund of Knowledge:Fair  Language: Fair  Akathisia:  No  Handed:  Right  AIMS (if indicated):     Assets:  Desire for Improvement  ADL's:  Intact  Cognition: WNL  Sleep:  Number of Hours: 6.5     Current Medications: Current Facility-Administered Medications  Medication Dose Route Frequency Provider Last Rate Last Dose  . acetaminophen (TYLENOL) tablet 650 mg  650 mg Oral Q6H PRN Ursula Alert, MD   650 mg at 06/08/14 2215  . albuterol (PROVENTIL HFA;VENTOLIN HFA) 108 (90 BASE) MCG/ACT inhaler 2 puff  2 puff Inhalation Q4H PRN Benjamine Mola, FNP   2 puff at 06/12/14 0802   . alum & mag hydroxide-simeth (MAALOX/MYLANTA) 200-200-20 MG/5ML suspension 30 mL  30 mL Oral Q4H PRN Ursula Alert, MD      . azithromycin (ZITHROMAX) tablet 250 mg  250 mg Oral Daily Benjamine Mola, FNP   250 mg at 06/12/14 0801  . chlordiazePOXIDE (LIBRIUM) capsule 25 mg  25 mg Oral Daily Saramma Eappen, MD   25 mg at 06/12/14 0800  . dicyclomine (BENTYL) tablet 20 mg  20 mg Oral Q6H PRN Ursula Alert, MD      . fluconazole (DIFLUCAN) tablet 150 mg  150 mg Oral Daily PRN Benjamine Mola, FNP      . guaiFENesin (MUCINEX) 12 hr tablet 1,200 mg  1,200 mg Oral BID Benjamine Mola, FNP   1,200 mg at 06/12/14 0800  . hydrOXYzine (ATARAX/VISTARIL) tablet 25 mg  25 mg Oral Q6H PRN Laverle Hobby, PA-C   25 mg at 06/11/14 2240  . magnesium hydroxide (MILK OF MAGNESIA) suspension 30 mL  30 mL Oral Daily PRN Ursula Alert, MD      . menthol-cetylpyridinium (CEPACOL) lozenge 3 mg  1 lozenge Oral PRN Nicholaus Bloom, MD      . methocarbamol (ROBAXIN) tablet 500 mg  500 mg Oral Q8H PRN Ursula Alert, MD   500 mg at 06/11/14 7322  . multivitamin with minerals tablet 1 tablet  1 tablet Oral Daily Ursula Alert, MD   1 tablet at 06/12/14 0801  . naproxen (NAPROSYN) tablet 500 mg  500 mg Oral BID PRN Ursula Alert, MD   500 mg at 06/12/14 1242  . sodium chloride (OCEAN) 0.65 % nasal spray 2 spray  2 spray Each Nare PRN Benjamine Mola, FNP   2 spray at 06/09/14 1449  . thiamine (B-1) injection 100 mg  100 mg Intramuscular Once Ursula Alert, MD   100 mg at 06/08/14 2000  . thiamine (VITAMIN B-1) tablet 100 mg  100 mg Oral Daily Ursula Alert, MD   100 mg at 06/12/14 0801  . traZODone (DESYREL) tablet 50 mg  50 mg Oral QHS,MR X 1 Spencer E Simon, PA-C   50 mg at 06/11/14 2240  . venlafaxine XR (EFFEXOR-XR) 24 hr capsule 75 mg  75 mg Oral Q breakfast Nicholaus Bloom, MD   75 mg at 06/12/14 0254    Lab Results:  Results for orders placed or performed during the hospital encounter of 06/08/14 (from the past 48  hour(s))  HIV antibody     Status: None   Collection Time: 06/12/14  6:36 AM  Result Value Ref Range   HIV Screen 4th Generation wRfx Non Reactive Non Reactive    Comment: (NOTE) Performed At: [facility] [address] [place], [place] [id number] Lindon Romp MD [id number] Performed at [facility]     Physical  Findings: AIMS: Facial and Oral Movements Muscles of Facial Expression: None, normal Lips and Perioral Area: None, normal Jaw: None, normal Tongue: None, normal,Extremity Movements Upper (arms, wrists, hands, fingers): None, normal Lower (legs, knees, ankles, toes): None, normal, Trunk Movements Neck, shoulders, hips: None, normal, Overall Severity Severity of abnormal movements (highest score from questions above): None, normal Incapacitation due to abnormal movements: None, normal Patient's awareness of abnormal movements (rate only patient's report): No Awareness, Dental Status Current problems with teeth and/or dentures?: No Does patient usually wear dentures?: No  CIWA:  CIWA-Ar Total: 2 COWS:  COWS Total Score: 1  Treatment Plan Summary: Daily contact with patient to assess and evaluate symptoms and progress in treatment and Medication management Supportive approach/coping skills Polysubstance dependence; continue to work a relapse prevention plan/motivational interviewing to create insight Depression; pursue the Effexor further Family conflict; work on ways of better communicating with her parents Use CBT/mindfulness  Medical Decision Making:  Review of Psycho-Social Stressors (1), Review of Medication Regimen & Side Effects (2) and Review of New Medication or Change in Dosage (2)     Jennifer Payes A 06/12/2014, 4:13 PM

## 2014-06-13 LAB — URINE CULTURE
Colony Count: 2000
Special Requests: NORMAL

## 2014-06-13 MED ORDER — ADULT MULTIVITAMIN W/MINERALS CH: 1.0000 | ORAL_TABLET | Freq: Every day | ORAL | Status: AC

## 2014-06-13 MED ORDER — VENLAFAXINE HCL ER 75 MG PO CP24: 75.0000 mg | ORAL_CAPSULE | Freq: Every day | ORAL | Status: DC

## 2014-06-13 MED ORDER — TRAZODONE HCL 50 MG PO TABS: 50.0000 mg | ORAL_TABLET | Freq: Every evening | ORAL | Status: AC | PRN

## 2014-06-13 NOTE — Progress Notes (Signed)
Pt discharged home. DC instructions provided and explained. Medications reviewed and Rx given.  Belongings returned. Pt denies SI, HI, AVH, no psychosis noted. All questions answered. Pt stable at discharge. Pt escorted to family member via staff.

## 2014-06-13 NOTE — [facility] Group Notes (Signed)
[facility]   06/13/2014 9:34 AM  Participation Quality:  Appropriate   Mood/Affect:  Appropriate  Depression Rating:  1  Anxiety Rating:  2  Thoughts of Suicide:  No Will you contract for safety?   NA  Current AVH:  No  Plan for Discharge/Comments:  Pt reports that she is ready to d/c today and plans to get picked up early (between 10am-11AM) with Dr. Runell GessLugo's consent. Pt plan to stay with her friend over the weekend while she and her father work on housing ([place] house list provided and apt (low income housing) list provided). Pt has follow-up scheduled with PHP in [facility] for Monday.   Transportation Means: friend coming between [employer]10-11AM  Supports: some friends; parents   Smart, Lebron QuamHeather LCSWA

## 2014-06-13 NOTE — [facility] Suicide Risk Assessment (Signed)
[facility]   Demographic Factors:  Adolescent or young adult and Caucasian  Total Time spent with patient: 30 minutes  Musculoskeletal: Strength & Muscle Tone: within normal limits Gait & Station: normal Patient leans: N/A  Psychiatric Specialty Exam: Physical Exam  Review of Systems  Constitutional: Negative.   HENT: Negative.   Eyes: Negative.   Respiratory: Negative.   Cardiovascular: Negative.   Gastrointestinal: Negative.   Genitourinary: Negative.   Musculoskeletal: Negative.   Skin: Negative.   Endo/Heme/Allergies: Negative.   Psychiatric/Behavioral: Positive for substance abuse. The patient is nervous/anxious.     Blood pressure 123/78, pulse 82, temperature 97.8 F (36.6 C), temperature source Oral, resp. rate 16, height 5\' 6"  (1.676 m), weight 57.153 kg (126 lb), SpO2 99 %.Body mass index is 20.35 kg/(m^2).  General Appearance: Fairly Groomed  Patent attorneyye Contact::  Fair  Speech:  Clear and Coherent409  Volume:  Normal  Mood:  Euthymic  Affect:  Appropriate  Thought Process:  Coherent and Goal Directed  Orientation:  Full (Time, Place, and Person)  Thought Content:  plans as she moves on, relapse prevention plan  Suicidal Thoughts:  No  Homicidal Thoughts:  No  Memory:  Immediate;   Fair Recent;   Fair Remote;   Fair  Judgement:  Fair  Insight:  Present  Psychomotor Activity:  Normal  Concentration:  Fair  Recall:  Fis[employer]Fair  Fund of Knowledge:Fair  Language: Fair  Akathisia:  No  Handed:  Right  AIMS (if indicated):     Assets:  Desire for Improvement Social Support  Sleep:  Number of Hours: 6.5  Cognition: WNL  ADL's:  Intact   Have you used any form of tobacco in the last 30 days? (Cigarettes, Smokeless Tobacco, Cigars, and/or Pipes): Yes  Has this patient used any form of tobacco in the last 30 days? (Cigarettes, Smokeless Tobacco, Cigars, and/or Pipes) Yes, A prescription for an FDA-approved tobacco cessation medication was  offered at discharge and the patient refused  Mental Status Per Nursing Assessment::   On Admission:  Suicidal ideation indicated by patient  Current Mental Status by Physician: In full contact with reality. There are no active S/S of withdrawal. There are no active SI plans or intent. She is willing and [facility]. Will be getting a withdrawal from school. Will be working on getting herself on track, will work on abstaining from smoking marijuana   Loss Factors: NA  Historical Factors: NA  Risk Reduction Factors:   Positive social support  Continued Clinical Symptoms:  Depression:   Comorbid alcohol abuse/dependence Impulsivity Alcohol/Substance Abuse/Dependencies  Cognitive Features That Contribute To Risk:  Closed-mindedness    Suicide Risk:  Minimal: No identifiable suicidal ideation.  Patients presenting with no risk factors but with morbid ruminations; may be classified as minimal risk based on the severity of the depressive symptoms  Principal Problem: Polysubstance dependence Discharge Diagnoses:  Patient Active Problem List   Diagnosis Date Noted  . Acute bronchitis, bacterial [J00] 06/09/2014  . Polysubstance dependence [F19.20]   . 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 Substance induced mood disorder [F19.94] 06/08/2014    Follow-up Information    Follow up with [facility] On 06/16/2014.   Why:  Appt on this date for partial hospitalization program assessment at 8:30AM.    Contact information:   [address] [place], [place] [address] Phone: [phone] Fax: [phone]  Plan Of Care/Follow-up recommendations:  Activity:  as tolerated Diet:  regular Follow up [facility] Is patient on multiple antipsychotic therapies at discharge:  No   Has Patient had three or more failed trials of antipsychotic monotherapy by history:   No  Recommended Plan for Multiple Antipsychotic Therapies: NA    Awab Abebe A 06/13/2014, 9:26 AM

## 2014-06-13 NOTE — Progress Notes (Signed)
Adult Psychoeducational Group Note  Date:  06/13/2014 Time:  10:57 AM  Group Topic/Focus:  Self Care:   The focus of this group is to help patients understand the importance of self-care in order to improve or restore emotional, physical, spiritual, interpersonal, and financial health.  Participation Level:  Active  Participation Quality:  Appropriate  Affect:  Appropriate  Cognitive:  Appropriate  Insight: Appropriate  Engagement in Group:  Engaged  Modes of Intervention:  Activity  Additional Comments:  Student nurses used meditation through music to determine if patient's anxiety improved.  Wynema BirchCagle, Cody Oliger D 06/13/2014, 10:57 AM

## 2014-06-13 NOTE — Discharge Summary (Signed)
Physician Discharge Summary Note  Patient:  Shannon [name] is an 24 y.o., female MRN:  [id number] DOB:  Mar 10, 1990 Patient phone:  [phone] (home)  Patient address:   [address] [place] [address],  Total Time spent with patient: 30 minutes  Date of Admission:  06/08/2014 Date of Discharge: 06/13/14  Reason for Admission:  Mood stabilization treatments   Principal Problem: Polysubstance dependence Discharge Diagnoses: Patient Active Problem List   Diagnosis Date Noted  . Acute bronchitis, bacterial [J00] 06/09/2014  . Polysubstance dependence [F19.20]   . 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 Substance induced mood disorder [F19.94] 06/08/2014    Musculoskeletal: Strength & Muscle Tone: within normal limits Gait & Station: normal Patient leans: N/A  Psychiatric Specialty Exam: Physical Exam  Psychiatric: She has a normal mood and affect. Her speech is normal and behavior is normal. Judgment and thought content normal. Cognition and memory are normal.    Review of Systems  Constitutional: Negative.   HENT: Negative.   Eyes: Negative.   Respiratory: Negative.   Cardiovascular: Negative.   Gastrointestinal: Negative.   Genitourinary: Negative.   Musculoskeletal: Negative.   Skin: Negative.   Neurological: Negative.   Endo/Heme/Allergies: Negative.   Psychiatric/Behavioral: Positive for substance abuse (Positive for opiates and marijuana on admission ). Negative for depression, suicidal ideas (Negative ), hallucinations and memory loss. The patient is not nervous/anxious and does not have insomnia.     Blood pressure 123/78, pulse 82, temperature 97.8 F (36.6 C), temperature source Oral, resp. rate 16, height  (1.676 m), weight 57.153 kg (126 lb), SpO2 99 %.Body mass index is 20.35 kg/(m^2).  See Physician SRA     Past Medical History:  Past Medical History  Diagnosis Date  . Anxiety   . ADHD  (attention deficit hyperactivity disorder)   . Depression     Past Surgical History  Procedure Laterality Date  . Wisdom tooth extraction     Family History: History reviewed. No pertinent family history. Social History:  History  Alcohol Use  . Yes     History  Drug Use Not on file    History   Social History  . Marital Status: Single    Spouse Name: N/A  . Number of Children: N/A  . Years of Education: N/A   Social History Main Topics  . Smoking status: Current Every Day Smoker  . Smokeless tobacco: Not on file  . Alcohol Use: Yes  . Drug Use: Not on file  . Sexual Activity: Not on file   Other Topics Concern  . None   Social History Narrative    Risk to Self: Is patient at risk for suicide?: Yes Risk to Others:   Prior Inpatient Therapy:   Prior Outpatient Therapy:    Level of Care:  OP  Hospital Course:   Shannon [name] is an 24 y.o. female. Patient presented to the ED with a chief complaint of sore throat and depression. Patient is currently endorsing suicidal ideations with no clear plan. Patient denies homicidal ideaiotns, hallucinations, and other self-injurious behaviors. Patient reports being overwhelmed with school being a senior at [employer] and not able to graduate in the spring because of lacking enough credits. Patient reports the drug activities violence on campus are out of control. Patient reports her substance use includes marijuana, hallucinogens, and benzodiazepines. Patient reports in the last months using hallucinogens (acid, mushrooms, and mollie) daily and started at the age of 38.  Patient reports abusing her prescribed Klonopin up to 7 0.5mg  pills daily.          Shannon [name] was admitted to the adult unit where she was evaluated and her symptoms were identified. Medication management was discussed and implemented. Her Effexor XR was continued at 75 mg daily for treatment of depression. She was encouraged to participate in unit  programming. Medical problems were identified and treated appropriately. During the first part of her admission, the patient was experiencing symptoms of bacterial pharyngitis. She reported having trouble breathing and swallowing. The patient's symptoms improved significantly after completing a course of azithromycin. Home medication was restarted as needed. She was evaluated each day by a clinical provider to ascertain the patient's response to treatment.  Improvement was noted by the patient's report of decreasing symptoms, improved sleep and appetite, affect, medication tolerance, behavior, and participation in unit programming.  The patient was asked each day to complete a self inventory noting mood, mental status, pain, new symptoms, anxiety and concerns.         She responded well to medication and being in a therapeutic and supportive environment. Positive and appropriate behavior was noted and the patient was motivated for recovery.  She worked closely with the treatment team and case manager to develop a discharge plan with appropriate goals. Coping skills, problem solving as well as relaxation therapies were also part of the unit programming. Patient reported having a hard time with worry about where to stay after discharge. Shannon Fredericlla was able to inform her parents with more details about what had been          By the day of discharge she was in much improved condition than upon admission.  Symptoms were reported as significantly decreased or resolved completely. The patient denied SI/HI and voiced no AVH. She was motivated to continue taking medication with a goal of continued improvement in mental health. Shannon [name] was discharged home with a plan to follow up as noted below. The patient was provided with sample medications and prescriptions at time of discharge. She left [facility] in stable condition with all belongings returned to her. Patient has follow-up scheduled with PHP in [facility] for Monday.  The patient appeared motivated to address her substance abuse problems.   Consults:  psychiatry  Significant Diagnostic Studies:  Thyroid tests, Hepatitis panel negative, UA, Chemistry panel, CBC,   Discharge Vitals:   Blood pressure 123/78, pulse 82, temperature 97.8 F (36.6 C), temperature source Oral, resp. rate 16, height 5\' 6"  (1.676 m), weight 57.153 kg (126 lb), SpO2 99 %. Body mass index is 20.35 kg/(m^2). Lab Results:   Results for orders placed or performed during the hospital encounter of 06/08/14 (from the past 72 hour(s))  GC/Chlamydia probe amp ([place]oyd)     Status: None   Collection Time: 06/11/14 12:00 AM  Result Value Ref Range   Chlamydia Negative     Comment: Normal Reference Range - Negative   Neisseria gonorrhea Negative     Comment: Normal Reference Range - Negative  Urine culture     Status: None   Collection Time: 06/11/14 10:56 PM  Result Value Ref Range   Specimen Description      URINE, CLEAN CATCH Performed at [facility]    Special Requests      Normal Performed at [facility]    Colony Count      2,000 COLONIES/ML Performed at [facility]    Culture  INSIGNIFICANT GROWTH Performed at [facility]    Report Status 06/13/2014 FINAL   HIV antibody     Status: None   Collection Time: 06/12/14  6:36 AM  Result Value Ref Range   HIV Screen 4th Generation wRfx Non Reactive Non Reactive    Comment: (NOTE) Performed At: [facility] [address] [place], [place] [id number] Mila Homer MD [id number] Performed at [facility]     Physical Findings: AIMS: Facial and Oral Movements Muscles of Facial Expression: None, normal Lips and Perioral Area: None, normal Jaw: None, normal Tongue: None, normal,Extremity Movements Upper (arms, wrists, hands, fingers): None, normal Lower (legs, knees, ankles, toes): None, normal, Trunk Movements Neck,  shoulders, hips: None, normal, Overall Severity Severity of abnormal movements (highest score from questions above): None, normal Incapacitation due to abnormal movements: None, normal Patient's awareness of abnormal movements (rate only patient's report): No Awareness, Dental Status Current problems with teeth and/or dentures?: No Does patient usually wear dentures?: No  CIWA:  CIWA-Ar Total: 0 COWS:  COWS Total Score: 0   See Psychiatric Specialty Exam and Suicide Risk Assessment completed by Attending Physician prior to discharge.  Discharge destination:  Home  Is patient on multiple antipsychotic therapies at discharge:  No   Has Patient had three or more failed trials of antipsychotic monotherapy by history:  No  Recommended Plan for Multiple Antipsychotic Therapies: NA     Medication List    STOP taking these medications        busPIRone 10 MG tablet  Commonly known as:  BUSPAR     lidocaine 2 % solution  Commonly known as:  XYLOCAINE      TAKE these medications      Indication   ibuprofen 200 MG tablet  Commonly known as:  ADVIL,MOTRIN  Take 400 mg by mouth every 6 (six) hours as needed for fever or moderate pain.      multivitamin with minerals Tabs tablet  Take 1 tablet by mouth daily.   Indication:  Vitamin Supplementation     traZODone 50 MG tablet  Commonly known as:  DESYREL  Take 1 tablet (50 mg total) by mouth at bedtime and may repeat dose one time if needed.   Indication:  Trouble Sleeping     venlafaxine XR 75 MG 24 hr capsule  Commonly known as:  EFFEXOR-XR  Take 1 capsule (75 mg total) by mouth daily with breakfast.   Indication:  Major Depressive Disorder           Follow-up Information    Follow up with [facility] On 06/16/2014.   Why:  Appt on this date for partial hospitalization program assessment at 8:30AM.    Contact information:   [address] [place], [place] [address] Phone:  [phone] Fax: [phone]      Follow-up recommendations:   Activity: as tolerated Diet: regular Follow up [facility]  Comments:   Take all your medications as prescribed by your mental healthcare provider.  Report any adverse effects and or reactions from your medicines to your outpatient provider promptly.  Patient is instructed and cautioned to not engage in alcohol and or illegal drug use while on prescription medicines.  In the event of worsening symptoms, patient is instructed to call the crisis hotline, 911 and or go to the nearest ED for appropriate evaluation and treatment of symptoms.  Follow-up with your primary care provider for your other medical issues, concerns  and or health care needs.   Total Discharge Time: Greater than 30 minutes   Signed: DAVIS, LAURA NP-C 06/13/2014, 9:24 AM  I personally assessed the patient and formulated the plan Madie Reno A. Dub Mikes, M.D.

## 2014-06-13 NOTE — Progress Notes (Signed)
  [facility] Discharge Plan :  Will you be returning to the same living situation after discharge:  No. pt [place] stay [place] until she and her father find apt for rent.  At discharge, do you have transportation home?: Yes,  friend Do you have the ability to pay for your medications: Yes,  [employer] Private insurance  Release of information consent forms completed and submitted to medical records by CSW.  Patient to Follow up at: Follow-up Information    Follow up with [facility] On 06/16/2014.   Why:  Appt on this date for partial hospitalization program assessment at 8:30AM.    Contact information:   [address] [place], [place] [address] Phone: [phone] Fax: [phone]      [facility].   Why:  If you return to school, please contact office to schedule therapy appt with Gaither.   Contact information:   ATTN Gaither Tuerrell [address] [place], [place] [address] Phone: [phone] Fax: [phone]      Follow up with [facility] On 07/23/2014.   Why:  Appt with Crystal on this date at 2:00PM for medication management.    Contact information:   ATTN: Mont Duttonrystal Montegue NP [address] [place], [place] [address] Phone: [phone] Fax: [phone]      Patient denies SI/HI: Yes,  during group/self report.     Safety Planning and Suicide Prevention discussed: Yes,  SPE completed with pt's father. SPI pamphlet provided to pt and she was encouraged to share information with support network, ask questions, and talk about any concerns relating to SPE.  Have you used any form of tobacco in the last 30 days? (Cigarettes, Smokeless Tobacco, Cigars, and/or Pipes): Yes  Has patient been referred to the Quitline?: Patient refused referral  Smart, Lebron QuamHeather LCSWA 06/13/2014, 9:57 AM

## 2014-06-16 ENCOUNTER — Other Ambulatory Visit (HOSPITAL_COMMUNITY): Payer: BLUE CROSS/BLUE SHIELD | Attending: Psychiatry | Admitting: Licensed Clinical Social Worker

## 2014-06-16 ENCOUNTER — Encounter (HOSPITAL_COMMUNITY): Payer: Self-pay | Admitting: Licensed Clinical Social Worker

## 2014-06-16 DIAGNOSIS — F322 Major depressive disorder, single episode, severe without psychotic features: Secondary | ICD-10-CM

## 2014-06-16 DIAGNOSIS — F1920 Other psychoactive substance dependence, uncomplicated: Secondary | ICD-10-CM | POA: Diagnosis not present

## 2014-06-16 DIAGNOSIS — F329 Major depressive disorder, single episode, unspecified: Secondary | ICD-10-CM | POA: Insufficient documentation

## 2014-06-16 DIAGNOSIS — F909 Attention-deficit hyperactivity disorder, unspecified type: Secondary | ICD-10-CM | POA: Insufficient documentation

## 2014-06-16 DIAGNOSIS — R45851 Suicidal ideations: Secondary | ICD-10-CM | POA: Diagnosis not present

## 2014-06-16 DIAGNOSIS — F1994 Other psychoactive substance use, unspecified with psychoactive substance-induced mood disorder: Secondary | ICD-10-CM

## 2014-06-16 NOTE — Psych (Signed)
[facility] Behavioral Health Partial Program Assessment Note  Date: 06/16/2014 Name: Shannon [name] MRN: [id number]  Chief Complaint: She has depression and anxiety and has used substances to self medicate. She is following from inpatient treatment.   Subjective: She relates that she was suicidal prior to her hospital admission. She was failing a lot of her classes in school, very bad fights with parents, had a lot of social stressors including her ex boyfriend who was a felon running from the police. She was hopeless, self-worth was not existent and recurrent thoughts of death. She had a lot of worrying. She used to have terrible panic attacks where she was hyperventilating on the floor, shaking, sweats, rapid heart beat-"full blown panic attacks" but are now controlled since she started with Effexor. She has a more positive attitude after hospitalization but she still has to work things out in her head. She reports severe anxiety, depression and worry. She has not felt suicidal since inpatient. Her PHQ-9 score indicates very severe depression.   HPI: Patient is a 24 y.o. Caucasian female presents with recent hospitalization, severe depression and anxiety.  Patient was enrolled in partial psychiatric program on 06/16/2014.  Primary complaints include: agitation, anxiety, anxiety attacks, concern about health problems, difficulty sleeping, difficulty with school, fear of going crazy, fear of impending doom, fearfulness, feeling depressed, financial problems, increased irritability, poor concentration, relationship difficulties, I thought I was losing my mind" and I don't want to go on living like this".  Onset of symptoms was abrupt with rapidly worsening course since February. Psychosocial Stressors include the following: family, financial, health, occupational, drug and alcohol and education. Depression-See above, also she feels guilty, difficulty concentrating, some problems with sleep. Denies past suicide  attempts but would get reckless with drugs instead. Denies past history of SA. Denies SIB. Anxiety-panic attacks in the past she recognizes were related to thoughts of school. Every few months she would have a bad one. On a weekly basis she would have minor ones that were controllable. She started Effexor and that has stopped panic attacks but can't recall when she was prescribed Effexor or last panic attack. Her sense of time is messed up related to substance use. Her" last couple of months have felt like four years".  Anxiety-excessive worry. Trauma-denies She denies AH/VH denies HI  I have reviewed the following documentation dated 06/09/14-Psychiatric Admission assessment for Inpatient [facility] that includes past psychiatric history, past medical history and past social and family history  Complaints of Pain: nonear Past Psychiatric History:  Past psychiatric hospitalizations 1x [facility] 06/08/14-06/13/14, Past medication trials-unable to recall. Per record they include Buspar, Effexor, Wellbutrin and Celexa and Therapy, Out Patient-Psychiatrist and therapist in high school  Currently in treatment with [facility]-she started there when a 24-year-old in college. It is use for getting prescriptions but not helpful for her emotionally.  Medications-"I have been on so many that can't list them". The ones she is on now seem to be effective. She was on Effexor before but it is different now because she is just Effexor while in the past it was Effexor with different combinations of medications. She has been on new medication adjustment for a week.  Substance Abuse History: Marijuana-15-occasionally, in college daily-can go through 3.5 grams daily L.U-06/07/14,  benzodiazepines-abusing her prescriptions and others Xanax/Klonopin-started 1 month ago, using up to 7.5 mg.-L.U.-06-07-14 hallucinogens-acid-started use at 47, mushrooms-started at 78, started abusing about  two months prior to her hospitalization and usage varied from maybe  abusing 1 time to a few times up to several times a week, Molly-first time in February,-can't identify last usage of hallucinogens except that it was some time in week before going to the hospital. cocaine-first time in February-2x in life-L.U.- Some time in February Use of Alcohol: occasional, social use few beers 1x a week-L.U.-06/06/14 Use of Caffeine: coffee 1 /day Use of over the counter: denies Clean time-a few months of sobriety while at boarding school in high school.  Past Surgical History  Procedure Laterality Date  . Wisdom tooth extraction      Past Medical History  Diagnosis Date  . Anxiety   . ADHD (attention deficit hyperactivity disorder)   . Depression    Outpatient Encounter Prescriptions as of 06/16/2014  Medication Sig  . traZODone (DESYREL) 50 MG tablet Take 1 tablet (50 mg total) by mouth at bedtime and may repeat dose one time if needed.  . venlafaxine XR (EFFEXOR-XR) 75 MG 24 hr capsule Take 1 capsule (75 mg total) by mouth daily with breakfast.  . ibuprofen (ADVIL,MOTRIN) 200 MG tablet Take 400 mg by mouth every 6 (six) hours as needed for fever or moderate pain.  . Multiple Vitamin (MULTIVITAMIN WITH MINERALS) TABS tablet Take 1 tablet by mouth daily. (Patient not taking: Reported on 06/16/2014)   Allergies  Allergen Reactions  . Lavender Oil     Throat swelling     History  Substance Use Topics  . Smoking status: Current Every Day Smoker -- 0.25 packs/day  . Smokeless tobacco: Not on file  . Alcohol Use: Yes   Functioning Relationships: poor relationship with parents with fights that are very stressful. patient  describes them as insensitive to her feelings. I.e [name] was upset about friend from boarding school who had died of cancer right before spring break and told them she wanted to kill herself. Mom pointed to a bridge a place to kill herself. Her Mom said things to made her feel insecure  and that she was judging her. [name] said she lashes out a little and then they lash out harder. Her sense of no self-worth comes from them. She calls her sister all the time and she is her main support. She is 25 and [place]. Her friends are a huge support. They all use drugs but more are responsible than others. Some of friends sold her the drugs.  Education: College       Please specify degree: She was a Holiday representativesenior but dropped out. She is not sure if she will finish Other Pertinent History: Stressors-Financial and primary supports, and education. She is living [facility] which is a home for people living together working on staying clean. They test for substance abuse through giving her a random urine.  Family History  Problem Relation Age of Onset  . Alcohol abuse Paternal Grandmother      Review of Systems None needed  Objective:  There were no vitals filed for this visit.  Physical Exam: No exam performed today, no exam necessary and .  Mental Status Exam: Appearance:  Casually dressed Psychomotor::  Within Normal Limits Attention span and concentration: Decreased Behavior: adequate rapport can be established Speech:  normal pitch and normal volume Mood:  depressed and anxious Affect:  normal and mood-congruent Thought Process:  within normal limits Thought Content:  not suicidal, not homicidal,  Orientation:  person, place, time/date and situation Cognition:  grossly intact Insight:  Fair Judgment:  Fair Estimate of Intelligence: Average [employer]  of knowledge: Impaired Memory: Recent impaired Abnormal movements: None Gait and station: Normal  Note: She became more tearful in interview in talking about parents and their fighting. She relates that her feeling of no self-worth comes from her parents.  Attitude toward interviewer: She also became more and more tearful and agitated with questions. She could not recall all information and frustrated with the process of  intake.   Assessment:  Diagnosis: No Primary Diagnosis found 1. Major depressive disorder, single episode, severe without psychotic features   2. Polysubstance (excluding opioids) dependence   3. Substance induced mood disorder     Indications for admission: Hilari is a 24 year old single Caucasian female who presents with severe depression and anxiety and who became agitated and tearful during interview indicating that she may still be going through withdrawal and emotional instability. She was self-medicating symptoms with substance abuse and has a severe history of substance abuse, few coping strategies besides substance abuse and no long term periods of sobriety.    Plan: 1.patient enrolled in Partial Hospitalization Program 2.Provide a structured setting to monitor mental stability and symptomology and provide further stabilization.   3.. Medication management to reduce current symptoms to base line and improve the patient's overall level of functioning   4. Develop treatment plan to decrease risk of relapse upon discharge and the need for readmission.   5. Psycho-social education regarding relapse prevention and self- care. 6.Lean and adapt new strategies to cope with stressors and mental health symptoms.  7.Family therapy as recommended      Treatment options and alternatives reviewed with patient and patient understands the above plan.   Comments: n/a .    Jeanee Fabre A

## 2014-06-17 ENCOUNTER — Other Ambulatory Visit (HOSPITAL_COMMUNITY): Payer: BLUE CROSS/BLUE SHIELD | Admitting: Licensed Clinical Social Worker

## 2014-06-17 DIAGNOSIS — F322 Major depressive disorder, single episode, severe without psychotic features: Secondary | ICD-10-CM

## 2014-06-17 DIAGNOSIS — F1920 Other psychoactive substance dependence, uncomplicated: Secondary | ICD-10-CM

## 2014-06-17 NOTE — Progress Notes (Signed)
Patient Discharge Instructions:  After Visit Summary (AVS):   Faxed to:  06/17/14 Discharge Summary Note:   Faxed to:  06/17/14 Psychiatric Admission Assessment Note:   Faxed to:  06/17/14 Suicide Risk Assessment - Discharge Assessment:   Faxed to:  06/17/14 Faxed/Sent to the Next Level Care provider:  06/17/14 Next Level Care Provider Has Access to the EMR, 06/17/14  Faxed to [facility] @ [phone] Faxed to [facility] Montague NP @ [phone] Records provided to [facility] via CHL/Epic access.   Jerelene ReddenSheena E Bieber, 06/17/2014, 3:46 PM

## 2014-06-17 NOTE — Progress Notes (Signed)
06/17/14  Initial Psychiatric Assessment/Admit Note to PHP.  Duration - 45 minutes   CC- " I was in the hospital"   HPI- patient is a 24 year old female who was recently admitted to the inpatient psychiatric unit here at [facility], and was discharged from the unit last Friday. She had originally presented to ED with severe sore throat, but had, during assessment, also endorsed depression and suicidal thoughts, resulting in psychiatric admission. She has a history of substance dependence, and had been using different drugs regularly, but identified hallucinogens as substance of choice over recent months. Upon discharge from hospital last Friday she has moved in to an [employer] in [place] . She states she is doing well there and reports she has not relapsed, although has had some cravings. She has been going to [employer] daily, as this is part of [employer]. She is presenting with a partially improved mood, although does not feel it is back to normal yet. She continues to report some neuro-vegetative symptoms of depression such as vague anhedonia, fair appetite, low self esteem ( although better now) . She denies any ongoing suicidal ideations and there are no symptoms of psychosis.  PPHx- reports a history of depression, and as noted had recent psychiatric admission for depression in the context of drug dependence. She denies any history of mania, denies any history of psychosis, she denies any history of suicide attempts, denies history of cutting, denies history of eating disorder, denies history of PTSD. She states she has been diagnosed with ADHD in the past . She  was seeing a psychiatrist prior to her admission, Dr.  Curley Spice . 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 States that in college  Hallucinogens such as LSD and mushrooms became substance of choice.  There is also a  history of BZD/Xanax ABuse. States there is a prior history of drinking alcohol in binges but not recently and does not describe alcohol abuse, denies opiate abuse .  Medical Hx- Denies medical illnesses, denies pregnancy, NKDA, smokes 5-6 cigarettes a day, recent episode of bronchitis, upper respiratory infection , now improved .  ROS- denies headaches, sore throat, rhinorrhea, coughing, SOB, chest pain, no vomiting, no rash, no fever or chills endorsed .  Current medications  Effexor XR 75 mgrs QDAY  Trazodone 50 mgrsd QHS PRN Insomnia  Social History- Single, no children, had been in college studying biology and film,  But withdrew semester, currently living at [address], parents live with [place], [place]. She reports stormy relationship with parents although states she knows they are supportive. Denies legal issues, denies relationship issues .  Family History- parents alive, live in [place], one sister, paternal grandmother alcoholic, does not endorse other family psychiatric history  MSE - alert and attentive, well related, slightly irritable, good eye contact,  Speech normal in tone, volume and rate, states mood  Partially improved and does present with a fuller range of affect. No thought disorder, denies SI or HI, denies hallucinations and does not appear internally preoccupied, no delusions . 0x3   Assessment- 24 year old female, status post inpatient admission for depression and suicidal ideations. Now improved, remains somewhat depressed but overall describes improving neuro-vegetative symptoms. Not suicidal or psychotic. She has a significant substance abuse history, which was likely contributing to her depression. Hallucinogens had become substance of choice ( but currently no symptoms of psychosis ).  States she has been abstinent since her discharge from unit, and is currently living in an [address] .  Dx- Major Depression without psychotic symptoms versus Substance Induced Mood  Disorder, Depressed  Polysubstance Dependence, - hallucinogens, BZDs, Cannabis .  Plan- Admit to PHP- provide support and encouragement regarding abstinence and ongoing NA participation. Monitor and promote improved mood and self esteem. For now , continue Effexor XR at 75 mgrs a day- does not need script at present.   Nehemiah MassedFernando Kamron Vanwyhe, MD

## 2014-06-17 NOTE — Psych (Signed)
[facility] PHP THERAPIST PROGRESS NOTE  Shannon Settlerlla Gilkerson [id number]  Session Time: 11:00 AM - 12:30 PM  Participation Level: Active  Behavioral Response: CasualAlertEuthymic  Type of Therapy: Group Therapy  Treatment Goals addressed: Anxiety, Coping and Diagnosis: MDD, Polysubstance Dependence, Learn how to Control Negative Thoughts and React Differently  Interventions: Motivational Interviewing  Summary: Shannon [name] is a 24 y.o. female who presents today with positive mood and first day of group. Group discussion related to the pros and cons of substance abuse. She said that the short-term advantages was that she used it to self-medicate and to escape. Therapist reviewed the long-term impact to include that it takes over control and runs your life, makes mental health symptoms worse and that your life starts to spiral downward. Shannon [name] was active in group participation and verbalized understanding of the negative long-term consequences which shows insight and willingness to apply healthier coping strategies to manage her mental health. She shared that she went to NA meeting, likes NA and feels that it is helpful because it is other addicts helping each other. She also feels that the [facility] is a supportive environment for her. She needs to work on positive coping strategies to take positive steps toward recovery. .    Suicidal/Homicidal: No  Therapist Response: Therapist introduced motivational strategies to discuss the pros and cons of substance abuse and help raise insight to long-term negative consequences. Therapist encouraged healthy coping strategies.   Plan: 1.Therapist utilize motivational strategies to encourage healthy coping of mental health symptoms.2.Shronda implement healthy coping strategies to manage stressors and emotions.   Session Time: 1:00 PM -2:00 PM  Participation Level: Active  Behavioral Response: CasualAlertEuthymic  Type of Therapy: Group Therapy  Treatment  Goals addressed:  Anxiety, Coping and Diagnosis: MDD, Polysubstance Dependence, Learn how to Control Negative Thoughts and React Differently, Utilize Positive Coping Strategies  Interventions: Seeking Safety, DBT  Summary: Therapist introduced worksheet from Seeking Safety on Safety. Therapist explained that safety is the priority right now. Therapist defined the goals of safety is to free yourself from substance abuse, stay alive, build healthy relationships, gain control over your feelings, learn to cope with day to day problems, protect your self from destructive people and situations, not hurt yourself or others, increase your functioning, and attain stability. Shannon [name] actively participated in group and identified impulsivity as main issue for her. She said that it is an adrenaline rush. We discussed DBT strategy of riding the wave of emotion rather than acting out on it impulsively and eventually the emotion passes. Therapist pointed out the coping strategy of "playing the tape through". Shannon [name] relates that her sister's reprimand is one of the most effective things that have helped her to not act impulsively. She identified coping strategies that are safe ones for her such as drawing. Therapist pointed out that treatment will involved the idea that you can learn to cope safety no matter what negative life events come your way. One area that we identified that needs addressed in treatment is impulsivity.    Suicidal/Homicidal: No  Therapist Response: Therapist discussed worksheet from Seeking Safety on "Safety" and how safety has to be the priority right now. We discussed what safety means and also identified that Shannon [name] needs to work on impulsivity and safe coping. Shannon LimboKiara needs to work on Building surveyoranger management and impulsivity.   Plan: 1.Shannon [name] work on goals of Pharmacist, communityestablishing safety in treatment.2.Shannon [name] work on impulsivity and other emotional regulation strategies to work on Environmental education officersafety goals.  Diagnosis: Primary  Diagnosis: Major depressive disorder, single episode, 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 Major depressive disorder, single episode, severe without psychotic features   2. Polysubstance (excluding opioids) dependence       [name],[name] 06/17/2014

## 2014-06-18 ENCOUNTER — Other Ambulatory Visit (HOSPITAL_COMMUNITY): Payer: BLUE CROSS/BLUE SHIELD | Admitting: Licensed Clinical Social Worker

## 2014-06-18 ENCOUNTER — Ambulatory Visit (HOSPITAL_COMMUNITY): Payer: Self-pay | Admitting: Licensed Clinical Social Worker

## 2014-06-18 DIAGNOSIS — F322 Major depressive disorder, single episode, severe without psychotic features: Secondary | ICD-10-CM

## 2014-06-18 DIAGNOSIS — F1920 Other psychoactive substance dependence, uncomplicated: Secondary | ICD-10-CM

## 2014-06-18 DIAGNOSIS — F329 Major depressive disorder, single episode, unspecified: Secondary | ICD-10-CM | POA: Diagnosis not present

## 2014-06-18 NOTE — Psych (Signed)
    Sharion Settlerlla Satterwhite [id number]  Adult Psychoeducational Group Note  Date:  06/18/2014 Time:  9:58 AM  Group Topic/Focus:  Making Healthy Choices:   The focus of this group is to help patients identify negative/unhealthy choices they were using prior to admission and identify positive/healthier coping strategies to replace them upon discharge.  Participation Level:  Active  Participation Quality:  Drowsy and Sharing  Affect:  Anxious and Tearful  Cognitive:  Alert  Insight: Lacking  Engagement in Group:  Engaged and Off Topic  Modes of Intervention:  Discussion, Problem-solving and Rapport Building  Additional Comments: [name] appeared tearful at intermittent times throughout the group. Her anxiety level is very high as marked by periods of tearfulness, pressured speech, nervousness and frequent apologies. Therapist noted that she is not able to distinguish healthy relationship patterns from her past relationships.  Latorria Zeoli 06/17/14, 9:58 AM

## 2014-06-18 NOTE — Psych (Signed)
[facility] PHP THERAPIST PROGRESS [facility]  Sharion Settlerlla [place] [id number]  Session Time: 11:00 AM - 12:30 PM  Participation Level: Active  Behavioral Response: CasualAlertAnxious and Euthymic  Type of Therapy: Group Therapy  Treatment Goals addressed: Anxiety, Coping and Diagnosis: Polysubstance Dependence, MDD, She will change the way she thinks and responds to things. She will utilize positive coping strategies, She will use alternative Coping Strategies to Substance use.  Interventions: Motivational Interviewing and Other: Relapse Prevention  Summary:  The group began with check in. Patients reviewed a handout titled "Triggers, Cravings, and High-Risk Situations". Patients discussed that an important part of relapse prevention is identifying your triggers and cravings or urges. You can't avoid or stop them unless you recognize them first. Your job is to figure out what people, places, situations, objects, beliefs and emotions, attitudes and reactions will put you at high risk for relapsing. Also that a return to drinking or using is most likely to happen if you return to the same situations, places and people involved in your most recent use.  [name] participated actively and showed good engagement but clearly showed symptoms of anxiety and agitation at times in group.  [name] relates she is having using dreams, anxiety dreams of being kicked out of the house and cravings. Therapist explained that she is in early phases of recovery and that her using dreams relate to her brain still craving drugs even though she is not using and also trying to resolve this anxiety in her dreams. Therapist also reviewed some of the negative consequences of using and [name] discussed some of the bad experiences for using and some of her motivations for not using.   Suicidal/Homicidal: No  Therapist Response: Therapist worked with [name] on some of the steps of early recovery including identifying triggers, cravings and high  risk situations and staying away from people, places and things. Therapist utilized motivational strategies by having [name] focus on some of the negative consequences of using and some of her reasons for staying clean.   Plan: 1.[name] continue to take positive steps to build a foundation for her recovery.2.[name] work on identifying triggers so she applies effective strategies to manage triggers.   Session Time: 1:00 PM-2:00 PM  Participation Level: Active  Behavioral Response: CasualAlertAnxious and Euthymic  Type of Therapy: Group Therapy  Treatment Goals addressed: Anxiety, Coping and Diagnosis: Polysubstance Dependence, MDD, She will change the way she thinks and responds to things and learn how to control her Negative thoughts, She will utilize positive coping strategies, She will use alternative Coping Strategies to Substance use,   Interventions: Seeking Safety, Family Systems  Summary:  Group topic was Safe Coping Skills and patient reviewed a handout. Patients discussed safe coping skills that they either use or would like to use. One of the skills was asking for help. [name] asks for help but this topic helped her to open up about her dysfunctional family dynamic. She said that she has asked her Mom for the type of support she needs but her Mom has been unwilling to provide it. She is misdirected in how she helps and does not know how to be empathetic and validate her feelings. She has told her Mom that she is the cause of her depression and has led to Adalay's thoughts that has questioned why she is here. She feels dysfunctional related to the rest of her family and has led to no self-esteem. There needs to be better communication within the family system to help change  unhealthy and dysfunctional nature of system that has contributed to Lacheryl's mental health symptoms and poor self-esteem. During session Brogan participated more actively but became more anxious and agitated in later session.    Suicidal/Homicidal: no  Therapist Response: Therapist discussed examples of safe coping skills that Roshelle uses or wants to use. Therapist identified unhealthy family dynamics and the need to work on better communication in [employer] treatment plan in order to make progress.   Plan: 1.Kemoni will work on taking steps to implement safe coping skills.2.Lajuana will take steps to change unhealthy family system and to have better communication within the family.   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 Major depressive disorder, single episode, severe without psychotic features   2. Polysubstance (excluding opioids) dependence       [name],[name] 06/18/2014

## 2014-06-19 ENCOUNTER — Ambulatory Visit (HOSPITAL_COMMUNITY): Payer: Self-pay | Admitting: Licensed Clinical Social Worker

## 2014-06-19 ENCOUNTER — Other Ambulatory Visit (HOSPITAL_COMMUNITY): Payer: BLUE CROSS/BLUE SHIELD | Admitting: Licensed Clinical Social Worker

## 2014-06-19 DIAGNOSIS — F322 Major depressive disorder, single episode, severe without psychotic features: Secondary | ICD-10-CM

## 2014-06-19 DIAGNOSIS — F329 Major depressive disorder, single episode, unspecified: Secondary | ICD-10-CM | POA: Diagnosis not present

## 2014-06-19 DIAGNOSIS — F1920 Other psychoactive substance dependence, uncomplicated: Secondary | ICD-10-CM

## 2014-06-19 NOTE — Psych (Signed)
   [facility]  Shannon [name] [id number]   Adult Psychoeducational Group Note  Date:  06/18/14 Time:  2:00-4:00  Group Topic/Focus: Developing a Wellness Toolbox:   The focus of this group is to help patients develop a "wellness toolbox" with skills and strategies to promote recovery upon discharge.  Participation Level:  Minimal  Participation Quality:  Drowsy and Inattentive  Affect:  Anxious and Irritable  Cognitive:  Appropriate  Insight: Lacking  Engagement in Group:  Defensive, Lacking and Poor  Modes of Intervention:  Activity, Discussion, Problem-solving, Rapport Building and Support  Additional Comments:  Shannon [name] stated that during the time Dr.Cobos was explaining the effects of dopamine of the brain that she had an overwhelming urge to "use". She describes her issues with substances in a "laissez a [place]" sort of way. She recounts events which took place within unhealthy relationships with fondness and refers to them as "the good ole days." She normalizes her negative behaviors and utilizes humor to deflect from having to offer feedback to direct questioning.  Suicidal/Homicidal: Negativewithout intent/plan  Plan: (1) [facility] (2) Pt will discuss and plan for a family therapy session (3) Pt will meet with Dr.Cabos for follow-up (4) Pt will continue to take all medications as prescribed (5) Pt will complete all assignments before returning to PHP   Diagnosis: No Primary Diagnosis found   No diagnosis found.    Shannon Corella, LCSW 06/18/14

## 2014-06-19 NOTE — Psych (Signed)
   [facility]  Shannon [name] [id number]  Session Time: 1:00 PM-2:00 PM  Participation Level: Active  Behavioral Response: CasualAlert and DrowsyEuthymic  Type of Therapy: Group Therapy  Treatment Goals addressed: Anxiety, Coping and Diagnosis: MDD, Polysubstance Dependence, She will learn to change the way she thinks and responds to things, She will utilize positive coping strategies, she will learn strategies such as DBT, distress tolerance.   Interventions: DBT-Distress Tolerance, Emotional Regulation, Mindfulness  Summary:  Group topic was emotional regulation. The group discussed different strategies to manage emotions including the concept of distress intolerance and that avoiding negative emotions are what causes people problems not the emotions themselves. Therapist explained that emotions in themselves are not bad but it is when we make judgments about them that we get into trouble.  Therapist explained that there are different places that you can intervene as well to manage emotion. Patient can make herself less vulnerable to negative emotion, to challenge thought distortions and to ride the wave of emotion. Therapist also discussed mindfulness and being the observer and watcher of the emotions. Shannon [name] acknowledges that she is experiencing all different sorts of emotions now that she is clean that she did not have to manage when using. Learning strategies for emotional regulation are therapeutic for her now that she is in early recovery. Shannon [name] showed engagement with the group but at times drifted off to sleep and explained that she had not slept well last night. Her anxiety has decreased significantly and she is more relaxed in group.   Suicidal/Homicidal: No  Therapist Response: Therapist discussed DBT strategies of distress tolerance, emotional regulation and mindfulness strategies to help patient learn strategies of emotional regulation.   Plan: 1.Patient will  apply emotional regulation strategies to help regulate her emotions more effectively.2.Patient will learn healthy coping strategies to manage emotions more effectively.  Session Time: 2:00 PM-4:00 PM  Participation Level: Active  Behavioral Response: CasualAlert and DrowsyEuthymic  Type of Therapy: Group Therapy  Treatment Goals addressed: Anxiety, Coping and Diagnosis: MDD, Polysubstance Dependence, She will Implement Positive Self-Talk, Establish Healthy Self-Esteem, She will learn alternative coping strategies to Substance Use.   Interventions: Coping-Strategies to Build Self-Esteem  Summary: Summary: Group discussed ways to build self-esteem. Members discussed strategies such as stop comparing yourself to others and define your own terms to live your life. We discussed building yourself up through short-term and long-term goals. We discussed developing supports and intimacy and cutting unhealthy people off. Therapist pointed out that taking care of your needs and engaging in activities you enjoy are other effective ways to build self-esteem. Shannon [name] described herself as a failure because she was dropping out of school and it is difficult because people are high achievers in her family. She shows engagement but less active in group as she kept drifting off to sleep. I think it was a positive sign to see her calmer in group. She needs to continue to work on self-esteem.   Suicidal/Homicidal: No  Therapist Response: Therapist discussed strategies to build self-esteem.  Plan: 1.Shannon [name] work on Diplomatic Services operational officerbuilding healthy self-esteem.2.Shannon [name] will learn alternative coping strategies to Substance Use.    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 Major depressive disorder, single episode, severe without psychotic features   2. Polysubstance (excluding opioids) dependence       Shannon [name] A 06/19/2014

## 2014-06-20 ENCOUNTER — Ambulatory Visit (HOSPITAL_COMMUNITY): Payer: Self-pay | Admitting: Licensed Clinical Social Worker

## 2014-06-20 ENCOUNTER — Other Ambulatory Visit (HOSPITAL_COMMUNITY): Payer: BLUE CROSS/BLUE SHIELD | Admitting: Licensed Clinical Social Worker

## 2014-06-20 ENCOUNTER — Encounter (HOSPITAL_COMMUNITY): Payer: Self-pay

## 2014-06-20 VITALS — BP 114/76 | HR 90 | Ht 66.0 in | Wt 122.6 lb

## 2014-06-20 DIAGNOSIS — F322 Major depressive disorder, single episode, severe without psychotic features: Secondary | ICD-10-CM

## 2014-06-20 DIAGNOSIS — F1920 Other psychoactive substance dependence, uncomplicated: Secondary | ICD-10-CM

## 2014-06-20 DIAGNOSIS — F1994 Other psychoactive substance use, unspecified with psychoactive substance-induced mood disorder: Secondary | ICD-10-CM

## 2014-06-20 DIAGNOSIS — F329 Major depressive disorder, single episode, unspecified: Secondary | ICD-10-CM | POA: Diagnosis not present

## 2014-06-20 NOTE — Psych (Signed)
   [facility]  Shannon [name] [id number]      Adult Psychoeducational Group Note  Date:  06/19/14 Time:  2:00-4:00  Group Topic/Focus: Emotional Education:   The focus of this group is to discuss what feelings/emotions are, and how they are experienced.  Participation Level:  Active  Participation Quality:  Appropriate and Attentive  Affect:  Anxious and Excited  Cognitive:  Alert  Insight: Good  Engagement in Group:  Developing/Improving  Modes of Intervention:  Activity, Clarification, Discussion and Education  Additional Comments:  Patients were assisted in identifying and accepting situations in which they have not had complete control or needed to tolerate uncertainty and unpleasant emotions.  Patients were assisted in accepting uncomfortable situations and  committing to accomplishing value-consistent goals. Shannon [name] was able to connect recent events in her life to constantly feeling "out of control" emotionally. She realizes the need to gain control over her emotions and has expressed desire to make changes.   Suicidal/Homicidal: Negativewithout intent/plan   Plan:  (1) [facility] (2) Pt will discuss and plan for a family therapy session (3) Pt will meet with Shannon [name] for follow-up (4) Pt will continue to take all medications as prescribed (5) Pt will complete all assignments before returning to PHP    Shannon Wisham, LCSW 06/19/14

## 2014-06-20 NOTE — Psych (Signed)
D. Patient presented with appropriate affect, level mood but restless with admitted anxious state most of the time.  Patient reported [facility], attending NA groups daily and had been able to maintain her sobriety.  Patient reported she had been in PHP the past 4 days and felt the videos and interactions were helping to keep her calm, sober and lowering her depression.  Patient denied any thoughts of wanting to harm herself or others, no suicidal or homicidal ideations since discharge from inpatient 06/13/14.  Patient denied any auditory or visual hallucinations, sleeping okay with Trazodone when she does not forget to take it.  Patient reports takes other medications daily and had discussed possible increase in Effexor with Dr. Jama Flavorsobos with plans to discuss this further in the coming week.  Patient reported initially not wanting to go up on the medication until she had gotten use to it but would discuss if needed at their next meeting.  A. Patient rated her depression at a 3, hopelessness at a 3 and anxiety at a 7 on a scale of 0-10 with 0 being none at all and 10 being the worst she had ever experienced.  Patient scored a 20 on the PHQ2-9 scale and reported most symptoms had improved since her inpatient discharge one week prior.  R. Patient stable at this time and denies any plans to return to substance use.  [facility] states [facility] and [facility] and hanging around friends "that don't use" helps keep her sober and is taking "one day at a time".  Patient discussed that she had dropped out of [employer] for this semester and really wanted to continue to concentrate on improving and not returning to SA.  Patient agreed to contact this nurse or follow up with emergency services if had any thoughts of wanting to harm self or others and would use house staff when episodes of craving increase.  Patient again  denies any current symptoms, no SI/HI and stated feeling the PHP was helping and filling her day with useful help to stay sober and to address her recent depression.

## 2014-06-20 NOTE — Psych (Signed)
Shannon [name].  [facility] BH PHP THERAPIST GROUP NOTE  Date:  06/20/2014 Time:  11:00 AM -12:30 PM  Group Topic/Focus:  Relapse Prevention Planning:   The focus of this group is to define relapse and discuss the need for planning to combat relapse. Also, the focus of the group was to identify unhealthy behaviors and learn coping tools to manage more effectively.   Participation Level:  Active  Participation Quality:  Appropriate, Attentive and Supportive  Affect:  Appropriate  Cognitive:  Appropriate  Insight: Improving  Engagement in Group:  Engaged  Modes of Intervention:  Discussion, Exploration and Problem-solving  Additional Comments:  Patients were assisted in exploring what they are learning from therapeutic process to work on issues and do things differently. Therapist showed video that helped explain the concept of both trusting the therapeutic process to help with change and recognizing that doing the same thing over and over gets you the same results. Shannon [name] said that she is not at a ponit of being able to make the best decisions for her recovery. Her supports in recovery are telling her that she needs to stay away from people, places and things but she is still going back to her old apartment to see her friends. Her old apartment is above the apartment that she describes as where drug dealers live and a "trap house." She has enough insight to realize that she shouldn't be doing it but she still goes there. Therapist is reinforcing making good choices in her recovery. Therapist is also using motivational interviewing strategies to help raise insight as to negative consequences of using and her goals to build motivation to stay clean. Shannon [name] also describes impulsivity. My therapeutic opinion is that I think it helps her to learn how other group members are applying coping strategies such as thinking through things to help with their impulsivity.  triggers. She needs to continue to work on goals of anger  and impulsivity  Suicidal/Homicidal: No  Plan: 1.Shannon [name] build insight as to healthy choices in order to make progress in early recovery. 2.Therapist help raise insight on the need to work on changing attitudes and behaviors in order to maintain abstinence.  [facility] BH PHP THERAPIST GROUP NOTE  Date:  06/20/2014 Time:  1:00 PM-2:00 PM  Group Topic/Focus:  Making Healthy Choices:   The focus of this group is to help patients identify negative/unhealthy choices they were using prior to admission and identify positive/healthier coping strategies to replace them upon discharge.  Participation Level:  Active  Participation Quality:  Appropriate  Affect:  Appropriate  Cognitive:  Appropriate  Insight: Appropriate  Engagement in Group:  Engaged  Modes of Intervention:  Activity, Discussion and Support  Additional Comments: The group watched and discussed a video titled "Today Means Amen" and "Message to a Depressed Friend". The group discussed that the reason the liked the videos was that it helped them to feel not alone with symptoms of depression and that there are people who know how it feels. It explains that they are choosing the depression but that it is bigger than them. Patients discussed how the video helped to explain what someone needs when dealing with depression and mental health symptoms. Patients felt that someone who will be there for you, listen and not assume what you need, and listen without judgment. The group discussed that support has all types of forms and that you have to let your supports know how you want to be supported. I think that this helps Shannon [name]  who is learning to cope with her mental health symptoms without using substances and realizing that she is not alone. It also gives her insight about reaching out to supports and letting them know what she needs.  She needs to continue to work on managing mental health symptoms and building a foundation for her recovery.    Suicidal/Homicidal: no  Plan:1.Shannon [name] learn coping strategies to manage mental health symptoms without using substances.2.Theapist educate patient on effective coping strategies to manage mental health symptoms.   Diagnosis: MDD, Polysubstance Dependence [place]

## 2014-06-20 NOTE — Psych (Signed)
   [facility]ERAPIST GROUP [facility]  Sharion Settlerlla [place] [id number]   Date:  06/20/2014 Time:  3:43 PM  Group Topic/Focus: Recovery Goals:   The focus of this group is to identify appropriate goals for recovery and establish a plan to achieve them. Self Care:   The focus of this group is to help patients understand the importance of self-care in order to improve or restore emotional, physical, spiritual, interpersonal, and financial health. Wellness Toolbox:   The focus of this group is to discuss various aspects of wellness, balancing those aspects and exploring ways to increase the ability to experience wellness.  Patients will create a wellness toolbox for use upon discharge.  Participation Level:  Active  Participation Quality:  Appropriate, Attentive and Sharing  Affect:  Appropriate  Cognitive:  Alert and Appropriate  Insight: Improving  Engagement in Group:  Engaged  Modes of Intervention:  Activity, Discussion, Education, Problem-solving and Support  Additional Comments:  Therapist assisted the group in processing a list of causes for and targets of possible misplaced anger. A list of targets of and causes for anger was processed in order to continue to increase awareness of anger management and decrease depressive issues by developing coping skills to utilize in the moment of stress.Therapist actively listened and connected comments to affirm their insight into anger triggers and the cognitive, physiological, and emotional factors. [name] indicated having a greater sensitivity to her angry feelings and the causes for them as a result of the focus on these issues. [name] is improving in her insight regarding what healthy lifestyles and relationships should look like. She has expressed that she will be attending a party with her best friend on today. Therapist questioned whether the party will be a trigger for her and cautioned her to utilize various strategies and techniques she'd learned if she  finds herself in a stressful situation. Therapist aided her in developing alternative strategies to implement in case of a crisis.   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 Major depressive disorder, single episode, severe without psychotic features   2. Polysubstance (excluding opioids) dependence   3. Substance induced mood disorder       Eather Chaires, LCSW 06/20/2014

## 2014-06-23 ENCOUNTER — Other Ambulatory Visit (HOSPITAL_COMMUNITY): Payer: BLUE CROSS/BLUE SHIELD | Admitting: Licensed Clinical Social Worker

## 2014-06-23 DIAGNOSIS — F329 Major depressive disorder, single episode, unspecified: Secondary | ICD-10-CM | POA: Diagnosis not present

## 2014-06-23 DIAGNOSIS — F322 Major depressive disorder, single episode, severe without psychotic features: Secondary | ICD-10-CM

## 2014-06-23 DIAGNOSIS — F1920 Other psychoactive substance dependence, uncomplicated: Secondary | ICD-10-CM

## 2014-06-23 NOTE — Psych (Signed)
[facility] PHP THERAPIST PROGRESS NOTE  Date:  06/23/2014 Time:  11:00 AM -12:30 PM  Group Topic/Focus:  Making Healthy Choices:   The focus of this group is to help patients identify negative/unhealthy choices they were using prior to admission and identify positive/healthier coping strategies to replace them upon discharge.  Participation Level:  Active  Participation Quality:  Appropriate and Supportive  Affect:  Appropriate  Cognitive:  Alert  Insight: Appropriate   Engagement in Group:  Engaged  Modes of Intervention:  Discussion, Exploration and Problem-solving  Additional Comments: Today's group topic was strategies to manage unhealthy behaviors such as being impulsive and stressors. The group began with [name] check in. Shannon [name] said she had [name] good weekend even though she it did not involve [name] lot of different activities and felt good about telling group that she had 16 days clean. The group gave her positive feedback for remaining clean. Shannon [name] relates that managing less stressors have helped her such as withdrawing from school. She shows insight in realizing that trying to manage school when she was not stable caused her to decompensate further and it makes sense to her stabilize rather than trying to manage an overwhelming stressors. She recognizes that she had to address her reckless and impulsive behaviors. She realizes that depression was tied up with it in her attitude of not caring but also impulsivity is [name] separate issue. Therapist talked about ways to manage impulsivity is to take [name] step back to "play the tape through" and to think about weighing the benefits of short-term benefits and long-term consequences. Shannon [name] discussed lack of support from parents as well and their inability to communicate effectively and lacked insight to give her the support that she needs. She is thinking it may be helpful to have [name] family meeting even if it is by telephone with her family. She needs to continue to  work on attitude and behavior change to help make progress with addictive behaviors.    Suicidal/Homicidal: No  Plan: 1.Shannon [name] work on [employer] coping tools for impulsivity and attitude and behavior change that will help her make progress in her recovery.Marland Kitchen.2.Therapist educate patient on recovery tools to help her build [name] foundation for her recovery.    [facility] PHP THERAPIST PROGRESS NOTE  Date:  06/23/2014 Time:  1:00 -2:00 PM  Group Topic/Focus:  Managing Feelings:   The focus of this group is to identify what feelings patients have difficulty handling and develop [name] plan to handle them in [name] healthier way upon discharge.  Participation Level:  Less active in afternoon group  Participation Quality:  Redirectable  Affect:  Appropriate  Cognitive:  Alert  Insight: Limited  Engagement in Group:  EngagedDistracted  Modes of Intervention:  Activity, Discussion and Education  Additional Comments: The group topic was on managing anxiety. An application called [employer] was presented to group with [name] variety of coping strategies to manage emotions. The group was led through [name] mindfulness exercise. We discussed setting [name] couple of goals for the day and then working on them. The application also showed [name] way to relate nutrition and exercise to improvement of symptoms. There was [name] place to write down thoughts and feelings and to identity cognitive distortions and to replace the distortions with [name] positive thought. The application also helped to identity emotions and also to relate it to specific events in the day for patient so see the connection. It also helped for patients to be more in touch with how they were  feeling so they could better manage their feeling. Shannon [name] needs to work on motivation to apply coping strategies she is learning to life situations.   Suicidal/Homicidal: No  Plan: 1.Shannon [name] gain insight as to the value of using coping strategies to manage emotions and to change attitudes and  behaviors. 2.Shannon [name] apply coping strategies to life situations.   Diagnosis: Major Depressive disorder, single episode, severe without psychotic features Polysubstance Dependence  Shannon [name] 06/23/2014, 2:44 PM

## 2014-06-24 ENCOUNTER — Other Ambulatory Visit (HOSPITAL_COMMUNITY): Payer: Self-pay

## 2014-06-24 ENCOUNTER — Ambulatory Visit (HOSPITAL_COMMUNITY): Payer: Self-pay | Admitting: Licensed Clinical Social Worker

## 2014-06-24 DIAGNOSIS — F1994 Other psychoactive substance use, unspecified with psychoactive substance-induced mood disorder: Secondary | ICD-10-CM

## 2014-06-24 DIAGNOSIS — F322 Major depressive disorder, single episode, severe without psychotic features: Secondary | ICD-10-CM

## 2014-06-24 DIAGNOSIS — F1920 Other psychoactive substance dependence, uncomplicated: Secondary | ICD-10-CM

## 2014-06-24 NOTE — Psych (Signed)
   [facility]  Shannon [name] [id number]   Date:  06/23/14 Time:  2:00pm  Group Topic/Focus: Building Self Esteem:   The Focus of this group is helping patients become aware of the effects of self-esteem on their lives, the things they and others do that enhance or undermine their self-esteem, seeing the relationship between their level of self-esteem and the choices they make and learning ways to enhance self-esteem. Emotional Education:   The focus of this group is to discuss what feelings/emotions are, and how they are experienced. Personal Choices and Values:   The focus of this group is to help patients assess and explore the importance of values in their lives, how their values affect their decisions, how they express their values and what opposes their expression.  Participation Level:  Minimal  Participation Quality:  Attentive, Sharing and Supportive  Affect:  Appropriate  Cognitive:  Alert  Insight: Lacking and Limited  Engagement in Group:  Developing/Improving, Engaged and Supportive  Modes of Intervention:  Activity, Clarification, Discussion, Education and Problem-solving  Additional Comments:   Patients were encouraged to demonstrate awareness by processing experiences that have strengthened self-awareness, personal values, and appreciation of life. Patients were provided with mindfulness strategies from [employer]. Therapist taught strategies to help decrease avoidance, disconnect thoughts from actions, acceptance of negative experiences rather than change or control symptoms and behave in accordance with broader life values. Patients were taught about clarifying goals and values and committed to behaving accordingly. Therapist taught alternative focal point techniques as a way to supplement relaxation skills for the management of the acute episodes of pain.  Suicidal/Homicidal: Negativewithout intent/plan  Plan: (1) [facility] (2)  Pt will discuss and plan for a family therapy session (3) Pt will meet with Dr.Cabos for follow-up (4) Pt will continue to take all medications as prescribed (5) Pt will complete all assignments before returning to PHP   Diagnosis: Primary Diagnosis: Major depressive disorder, single episode, 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 Major depressive disorder, single episode, severe without psychotic features   2. Polysubstance (excluding opioids) dependence   3. Substance induced mood disorder       Tishia Maestre, LCSW 06/24/2014

## 2014-06-25 ENCOUNTER — Other Ambulatory Visit (HOSPITAL_COMMUNITY): Payer: BLUE CROSS/BLUE SHIELD | Admitting: Licensed Clinical Social Worker

## 2014-06-25 DIAGNOSIS — F329 Major depressive disorder, single episode, unspecified: Secondary | ICD-10-CM | POA: Diagnosis not present

## 2014-06-25 DIAGNOSIS — F322 Major depressive disorder, single episode, severe without psychotic features: Secondary | ICD-10-CM

## 2014-06-25 DIAGNOSIS — F1920 Other psychoactive substance dependence, uncomplicated: Secondary | ICD-10-CM

## 2014-06-25 NOTE — Psych (Signed)
[facility] PHP Shannon [name] PROGRESS NOTE  Shannon [name] [id number]  Date:  06/25/2014 Time:  11:00 AM -12:30 PM  Group Topic/Focus:  The focus of this group is to discuss various aspects of wellness, balancing those aspects and exploring ways to increase the ability to experience wellness. Patients discussed ways to incorporate activities in the day that [name] help increase over all well-being.  Participation Level:  Active  Participation Quality:  Appropriate, Attentive and Sharing  Affect:  Appropriate and Euthymic  Cognitive:  Alert and Appropriate  Insight: Improving  Engagement in Group:  Engaged  Modes of Intervention:  Activity, Discussion and Exploration  Additional Comments: Group topic for today involved incorporating activities in the day that [name] support patients in overall well being.The group started with a check in. Shannon [name] related that she missed group yesterday because of lack of sleep. She did not sleep because of roommate that snored and she was running out of Trazadone so she did not have any to take. She related that she still goes to her old apartment even though she has been told that she needs to stay away from people, places and things. She did have a thought that she could have just one, but was able to talk herself out of it by telling herself that she could not just have one. She said she has the ability to teach herself things she is applying this skill now to teaching herself to not use. Shannon [name] gave her positive feedback for her strengths and abilities in teaching herself things. Shannon [name] introduced a reading from "The Book of Awakening" and discussed how doing a daily meditation can be helpful in starting your day and helpful in putting you in the right mindset for your day. It helps set your day to have positive experiences in the day.Shannon [name] read a daily medication titled "Birds [employer] Ornith[employer]".  Patients discussed the message of the reading. Shannon [name] said  that effective coping for her is her music and her friends. Shannon [name] shows good motivation and participation in group.    Homicidal/Suicidal: No  Plan:1.Shannon [name] incorporate activities in her day to help with increasing the experience of wellness and over all well-being.2.Shannon [name] [name] continue to work with Shannon [name] on developing insight as to build a solid recovery program and attitudes that [name] support recovery.3.Contact Shannon [name] about refilling Trazadone  [facility] PHP Shannon [name] PROGRESS NOTE  Date:  06/25/2014 Time:  1:00 PM-2:00 PM  Group Topic/Focus:  Rediscovering Joy:   The focus of this group the use of insights through poetry to develop cognitive strategies for effective coping including having patients reconnect and affirm positive experiences in their life.   Participation Level:  Active  Participation Quality:  Appropriate and Attentive  Affect:  Appropriate and Euthymic  Cognitive:  Alert and Appropriate  Insight: Improving  Engagement in Group:  Engaged  Modes of Intervention:  Activity, Discussion, Exploration and Support  Additional Comments: Group topic was healthy attitudes that [name] assist patients on their road to recovery. The discussion was introduced through the spoken word poetry of Shannon [name]. Patients discussed how life can knock you down and her poetry affirms this quality of life that is universal. It also can be a place of wonder. The universality of the the struggles helps patients in realizing it is a part of life for everyone and that everyone gets knocked down and they are not alone. There is also the recognition that there are a lot of positive experiences and aspects to  life as well that make life worthwhile. Shannon [name] described being very moved by the poetry and how it reaffirmed in beautiful way both our struggles and the value and beauty in life. Shannon [name] said she has more happiness now and does now want to die as she did prior to hospital admission. This is a  positive sign of growing motivation to stay clean. We explored some of her interests and talents and talked about getting back to them since she is no longer using drugs. She had euthymic mood and good participation and motivation. I think that cognitive strategies that help her see that she is not alone, and the positive experiences in life including are insights that allow for positive coping.   Suicidal/Homicidal: No  Plan: 1.Patient use cognitive insights as healthy coping strategies.2.Shannon [name] work with patient on building insights to help with effective coping strategies.   [facility] PHP Shannon [name] PROGRESS NOTE  Date:  06/25/2014 Time:  2:00 PM -4:00 PM  Group Topic/Focus:  Information on Substance Abuse from [employer] including a worksheet on how substance abuse presents healing from mental health symptoms and preparing for the journey of recovery .   Participation Level:  Active  Participation Quality:  Appropriate and Attentive  Affect:  Appropriate  Cognitive:  Alert and Appropriate  Insight: Improving  Engagement in Group:  Engaged  Modes of Intervention:  Discussion, Education and Exploration  Additional Comments: Group topic was on information sheets for Substance Abuse from [employer]. Patients discussed worksheet on How Substance Abuse Prevents healing from mental health symptoms and "Climbing Mount Recovery". Patient was assisted in recognizing that substance abuse makes mental health symptoms worse, prevents you from knowing yourself, does not get your needs met, stalls your emotional development, isolates you, keeps you from coping with feelings, takes away your control, makes you hate yourself and is a way of neglecting yourself. Shannon [name] related to how it took away from control, made her symptoms worse, neglected herself and in general agreed with the points of the worksheet that substance abuse had kept her stuck and spiraling downward. We discussed elements of  preparing for a trip in recovery. One of the elements was to prepare for bad weather and she related that things have been smooth and she is concerned about when she encounters a "bump". Shannon [name] explained the usefulness of learning and applying recovery tools on a daily basis. She discussed some of her struggles including getting use to not taking Adderall that helped her to focus. Shannon [name] gave her positive feedback for her healthy attitude in recovery of willingness to take direction and she said that she is motivated by people she has seen be stuck by not taking direction. She presents with good motivation and willing to use the group process to work on her issues.   Suicidal/Homicidal: No  Plan: 1.Shannon [name] continue to work with Festus Holts on building insight on how to implement a positive recovery program.2.Kayra continue to recovery tools on a daily basis to make progress in her recovery.   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 Major depressive disorder, single episode, severe without psychotic features   2. Polysubstance (excluding opioids) dependence       [name],[name] 06/25/2014

## 2014-06-26 ENCOUNTER — Other Ambulatory Visit (HOSPITAL_COMMUNITY): Payer: BLUE CROSS/BLUE SHIELD | Admitting: Psychiatry

## 2014-06-26 DIAGNOSIS — F1920 Other psychoactive substance dependence, uncomplicated: Secondary | ICD-10-CM

## 2014-06-26 DIAGNOSIS — F322 Major depressive disorder, single episode, severe without psychotic features: Secondary | ICD-10-CM

## 2014-06-26 NOTE — Psych (Signed)
[facility] PHP THERAPIST PROGRESS NOTE  Shannon [name] [id number]   Date:  06/26/2014 Time:  11:00 AM-12:30 PM  Group Topic/Focus:  Recovery: Principles of Recovery that Will Help Strengthen a Recovery Program  Participation Level:  Active  Participation Quality:  Appropriate and Attentive  Affect:  Appropriate  Cognitive:  Alert and Appropriate  Insight: Improving  Engagement in Group:  Engaged  Modes of Intervention:  Discussion, Education and Support  Additional Comments:  Group topic for today was handout on "7 Best Things You can Do for Yourself in Recovery". The group began with a check in. Shannon [name] still is not sleeping because of roommate who is snoring. She would like the doctor to prescribe Trazadone. Today is 19 days clean for her and therapist provided feedback to reinforce the effort she is putting into her recovery. Therapist reviewed some basic suggestions for strengthening one's capabilities and recovery foundation. These suggestions include no major life changes in the first year, get into the 12-step rooms, build structure into your life, find some new friends, focus on your needs, learn to shrug off disappointments, and build resiliency. We identified areas we she has implemented the suggestions and areas such as still spending time with old friends that put her at risk. Therapist spend some time talking about how major life changes bring stress and in early recovery you are not ready to handle significant stress. Patient needs time to become practiced in employing her coping mechanisms, to get familiar with a healthier routine, to develop new and sober relationships and and to begin to map out the kind of future that fits with the vision she wants for her life in sobriety. Shannon [name] in early recovery needs to take things day by day and at the same time build insight as to how to strengthen her recovery program.   Suicidal/Homicidal: No  Plan:1.Shannon [name] will implement a daily  recovery program and continue to gain insight as to how to build an effective recovery program.2.Therapist will provide support and help Shannon [name] in gaining insight as to strategies that will help her to make progress in her recovery program.   [facility] PHP THERAPIST PROGRESS NOTE   Date:  06/26/2014 Time:  1:00 PM-2:00 PM  Group Topic/Focus:  Dysfunctional Family Patterns-Help patients see the origin of some of their problems stem from unhealthy family patterns.   Participation Level:  Active  Participation Quality:  Appropriate and Attentive  Affect:  Appropriate  Cognitive:  Alert and Appropriate  Insight: Appropriate and Improving  Engagement in Group:  Engaged  Modes of Intervention:  Discussion and Exploration  Additional Comments: The group topic was how unhealthy family patterns have impacted patients. Therapist explained that the goal of working with families is to interrupt the pattern of pathological communication and behaviors and replace it with a new pattern that will sustain itself without the dysfunctional aspects of the original pattern. Therapist tied this to Shannon [name]'s family. Shannon [name] sees herself as the one who is the problem in the family. Therapist explained that in part her view of herself can be explained by dysfunction in family patterns. Shannon [name] herself is able to realize some of the unhealthiness in the patterns but yet still not able to detach enough to view herself in healthier ways besides being the problem child. Therapist needs to work on establishing healthier communication patterns within the family and help Shannon [name] be able to detach from family system so she is able to gain insight and not see herself as the  problem.   Suicidal/Homicidal: No  Plan:1.Therapist work with Shannon [name] and family to establish healthier communication patterns and behaviors.2.Therapist work with Shannon [name] on detaching from family system to help strengthen her sense of self.    Diagnosis: Primary  Diagnosis: Major depressive disorder, single episode, 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 Major depressive disorder, single episode, severe without psychotic features   2. Polysubstance (excluding opioids) dependence       Shannon [name],[name] 06/26/2014

## 2014-06-27 ENCOUNTER — Other Ambulatory Visit (HOSPITAL_COMMUNITY): Payer: BLUE CROSS/BLUE SHIELD | Admitting: Licensed Clinical Social Worker

## 2014-06-27 ENCOUNTER — Encounter (HOSPITAL_COMMUNITY): Payer: Self-pay

## 2014-06-27 ENCOUNTER — Encounter (HOSPITAL_COMMUNITY): Payer: Self-pay | Admitting: Licensed Clinical Social Worker

## 2014-06-27 ENCOUNTER — Ambulatory Visit (HOSPITAL_COMMUNITY): Payer: Self-pay | Admitting: Licensed Clinical Social Worker

## 2014-06-27 VITALS — BP 124/82 | HR 78 | Ht 66.0 in | Wt 122.2 lb

## 2014-06-27 DIAGNOSIS — F1920 Other psychoactive substance dependence, uncomplicated: Secondary | ICD-10-CM

## 2014-06-27 DIAGNOSIS — F1994 Other psychoactive substance use, unspecified with psychoactive substance-induced mood disorder: Secondary | ICD-10-CM

## 2014-06-27 DIAGNOSIS — F329 Major depressive disorder, single episode, unspecified: Secondary | ICD-10-CM | POA: Diagnosis not present

## 2014-06-27 DIAGNOSIS — F322 Major depressive disorder, single episode, severe without psychotic features: Secondary | ICD-10-CM

## 2014-06-27 NOTE — Psych (Signed)
   [facility]  Shannon [name] [id number]  Session Time: 1300-1400  Participation Level: Active  Behavioral Response: CasualAlertDepressed  Type of Therapy: Psycho-education Group  Treatment Goals addressed: Anxiety  Interventions: CBT, DBT, Solution Focused, Strength-based and Reframing  Summary: Shannon Settlerlla Rigel is a 24 y.o. female who presents with depressive, anxiety symptoms with hx of drug use.  [facility]  Reports she transitioned from the inpatient behavioral unit.  Admitted due to SI and drug use.  "My drug of choice were THC and acid; but it was getting to the point that I wanted whatever I could get my hands on."  Pt states her #1 support system is her oldest sister.  Reports she would like to learn better coping skills than depending on drugs.  "I've been clean for twenty days." Suicidal/Homicidal: Negativewithout intent/plan  Therapist Response: Discharge Planning:  Focused on setting treatment goals for program, assessed current and explored new resources, encouraged pt to make a plan for keeping herself safe while in the program and after discharge if ever she has SI again, and also discussed aftercare plan.  Pt states she will be transitioning to CD-IOP, in which she is receptive to.  Plan: Patient will continue in the partial program for it's entirety.  Diagnosis:   [name], M.Ed

## 2014-06-27 NOTE — Psych (Signed)
D. Patient presented with appropriate affect, depressed mood and acknowledged her depression had worsened a little over the past week.  Patient reported she met with Dr. Parke Poisson on 06/26/14 and was planning on going up on her Effexor and was also increasing her Trazodone to $RemoveBefo'100mg'rQHnvHOZNeW$  at bedtime due to ongoing problems with sleep.  Patient reported sleeping about 5 hours per night but her roommate often keeps her up snoring or when she gets up early in the morning to go to work.  Patient denies any substance use or abuse since being discharged from [facility].  Patient reports liking partial hospitalization when more people but does not like it when it is only her.  A. Patient rated her depression a 7, her hopelessness a 5 and her anxiety a 5 on a scale of 0-10 with 0 the worse she could experience and 10 the most.  Patient requested not to take the PHQ-9 survey scale today as states questions make her more anxious and depressed.  Patient denies any current thoughts of wanting to harm herself or others and agreed if any of these thoughts returned she would seek out assistance either through [facility] 24/7 assessment services, PHP staff, this nurse or a visit to any emergency departments.  R. Patient reported she would follow up with this nurse on 06/30/14 to tell how she is doing then and hopes discussed things she could try over the weekend to get her out of her [facility] placement.  Patient agreed she is possibly now mourning the loss of her recent life style but again denies any SI/HI, or plans or intent to harm self or others.  Patient agreed to speak with her sister more this weekend as patient admits she is a positive support for her and to continue with PHP attendance. Called Dr. Parke Poisson to question if he was seeing patient today or increasing her Effexor.  Dr. Parke Poisson increased to $RemoveBefo'75mg'LpGbovaqMdK$  2 po qam to begin on 06/28/14 morning.  Patient reported understanding this instruction and reported she had plenty to get through the weekend at  higher dosage and Dr. Parke Poisson reported he would give her a new prescription at the higher dosage when he sees her again in the coming week.  Patient reported understanding plan and was in agreement.  Patient again agreed to seek out services if any thoughts to want to harm herself or others returned over weekend.  Patient verbally agreed with plan and denied any safety concern at this time. Patient returned to [facility] to finish groups for the day.

## 2014-06-27 NOTE — Psych (Signed)
   [facility]  Sharion Settlerlla Heidecker [id number]    Date:  06/27/2014 Time:  4:51 PM  Group Topic/Focus: Early Warning Signs:   The focus of this group is to help patients identify signs or symptoms they exhibit before slipping into an unhealthy state or crisis. Emotional Education:   The focus of this group is to discuss what feelings/emotions are, and how they are experienced. Relapse Prevention Planning:   The focus of this group is to define relapse and discuss the need for planning to combat relapse.  Participation Level:  Active  Participation Quality:  Appropriate  Affect:  Anxious  Cognitive:  Alert  Insight: Good  Engagement in Group:  Engaged  Modes of Intervention:  Clarification, Discussion and Education  Additional Comments:  Patients were asked to discuss past traumatic experiences that have become triggers for anxiety. They were assisted in developing insight into how past traumatic experiences have led to anxiety in present unrelated circumstances. Many of the patients were able to offer valuable insight regarding their own past traumatic experiences and have demonstrated a reduction in the experience of their personal anxiety producing incident. Patients were asked to discuss important past and present life conflicts that also contribute to feelings of worry. These lists were processed within the group.  Suicidal/Homicidal: Negativewithout intent/plan  Plan:  (1) [facility] (2) Pt will discuss and plan for a family therapy session (3) Pt will meet with Dr.Cabos for follow-up (4) Pt will continue to take all medications as prescribed (5) Pt will complete all assignments before returning to PHP   Diagnosis: Primary Diagnosis: Major depressive disorder, single episode, 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 Major depressive disorder, single episode, severe without psychotic features   2. Polysubstance (excluding  opioids) dependence       Janisa Labus, LCSW 06/27/2014

## 2014-06-27 NOTE — Psych (Signed)
   [facility]  Shannon [name] [id number]   Date:  06/26/14 Time:  2:00pm  Group Topic/Focus: Conflict Resolution:   The focus of this group is to discuss the conflict resolution process and how it may be used upon discharge.Identifying Needs:   The focus of this group is to help patients identify their personal needs that have been historically problematic and identify healthy behaviors to address their needs.Self Care:   The focus of this group is to help patients understand the importance of self-care in order to improve or restore emotional, physical, spiritual, interpersonal, and financial health.  Participation Level:  Active  Participation Quality:  Appropriate  Affect:  Appropriate  Cognitive:  Appropriate  Insight: Good  Engagement in Group:  Engaged  Modes of Intervention:  Clarification, Discussion, Education, Limit-setting, Problem-solving and Support  Additional Comments:Therapist educated patients on Home DepotMurray Bowen's concept of triangulation within the [employer]Family Systems Theory. Therapist explored Salvador Minuchin's concept of enmeshment with family members and the comparison of unhealthy relationship boundaries. Therapist taught patients how to not to triangulate with family members by utilizing boundary supports such as: words of affection, encouragement, non-enabling support behaviors, etc.  Patients were asked to brainstorm potential problems or pitfalls (parentification of the child, secondary gain, etc.)  and to develop a contingency plan to circumvent these problems. De-triangulation strategies were modeled by the therapist to demonstrate how to encourage and support a family member's move towards independence and self-sufficiency without enabling. Shannon [name] was very emotional when she discussed her parents perceptions of her. She states that she feels she is a failure to them and that the only reason they are offering to help her is because they feel  obligated to do so. She states that her parents often tell her how they feel she is a burden to them and they have expressed that they would be happy if they did not have to contribute to her financial well-being. Therapist actively listened to Shannon [name] and addressed any cognitive distortion patterns she displayed during the discussion. Therapist offered unconditional support and positive regard during the discussion. [facility]rap[name] she stated that she has not been able to sleep for the past three days. Dr.Cobos prescribed 100mg  of Trazodone for sleep.   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 Major depressive disorder, single episode, severe without psychotic features   2. Polysubstance (excluding opioids) dependence   3. Substance induced mood disorder       Shannon Ebner, LCSW 06/27/2014

## 2014-06-28 NOTE — Progress Notes (Signed)
06/26/14  PHP Medication Management Visit.  Duration - 25 minutes   Dx- Substance Dependence ( Hallucinogen substance of choice ) Substance Induced Mood Disorder versus MDD without psychotic features .  Subjective - patient reports she is doing well insofar as sobriety. [facility] , [facility], and attending NA meetings regularly.  She denies side effects from medications at this time.  She states she has had intermittent but significant cravings for drugs ( hallucinogens have been substance of choice ) , and has had periods of visceral responses ( diaphoresis, nausea) to situations where people mention or talk about drugs.  Objective : I have reviewed case with therapists. She is motivated in treatment and sobriety and as above, has been a consistent participant.  Although improved, and denying any SI, remains anxious and depressed, which she attributes in part to her drug abuse and current situation , and to her feeling guilty about how drug abuse has affected her family. States father, [place], came down to help [facility], and appeared disappointed in her and  Uncaring.  Patient tearful while discussing these issues.  Describes some sadness, mild anhedonia, decreased sense of self esteem. Denies psychotic symptoms and does not appear internally preoccupied .  Denies medication side effects. ( On Effexor XR )   Responds well to support , encouragement, empathy. States that although depressed, she knows " things will get better", and remains optimistic.   Current Medications-  Effexor XR  75 mgrs QDAY  Trazodone 50 mgrs QHS PRN Insomnia  ROS- denies headache, denies chest pain, denies SOB, denies Cough, denies Abdominal pain or Distress, No rash, no fever , no chills, (+) cravings at times .  Assessment: Patient is  Still anxious and depressed, which she attributes partially to ruminations about how her drug use  has affected her life and her family relationships. Remains sober, and seems very motivated in sobriety at this time.  Plan:  Continue PHP to address depression and foster relapse prevention and sobriety. Continue to encourage NA  Attendance and recovery work . Increase Effexor XR to 150 mgrs QDAY to address depression and anxiety. Offered and encouraged family meeting with parents, if possible   Nehemiah MassedFernando Cobos, MD

## 2014-06-30 ENCOUNTER — Other Ambulatory Visit (HOSPITAL_COMMUNITY): Payer: BLUE CROSS/BLUE SHIELD | Admitting: Licensed Clinical Social Worker

## 2014-06-30 DIAGNOSIS — F329 Major depressive disorder, single episode, unspecified: Secondary | ICD-10-CM | POA: Diagnosis not present

## 2014-06-30 DIAGNOSIS — F322 Major depressive disorder, single episode, severe without psychotic features: Secondary | ICD-10-CM

## 2014-06-30 DIAGNOSIS — F1920 Other psychoactive substance dependence, uncomplicated: Secondary | ICD-10-CM

## 2014-06-30 NOTE — Psych (Signed)
06/30/14.  PHP Medication Management Note  Duration- 25 minutes  Dx- Substance Dependence ( Hallucinogen substance of choice ) Substance Induced Mood Disorder versus MDD without psychotic features .   Subjective- patient is feeling more depressed and ruminative today, focusing on family dynamics and her perception that family not supportive. States that " no matter what I do, they always say that it would be so much better if I would just do this or that differently, and during an argument with my mother she told me to jump off a bridge ".   Objective : I have discussed case with therapists and have met with patient. In the context of ongoing therapeutic focus on stressors, family issues, patient has continued to report family dysfunction/ stressors as above. She tends to ruminate about not feeling supported by her parents and in particular describes a difficult relationship with her mother. States that she has an older sister who has always been very successful and " does nothing wrong", so that patient feels like she is the " problem" of the family. She has been able to process these issues in groups, and as discussed with staff and noted during this visit, she is quite responsive to support, encouragement, empathy, and affect brightens considerably. She denies any drug abuse/relapse and remains sober x 24 days now. Still living at [facility]. States this has been very helpful insofar as providing structure , sober setting. Has ongoing conflict with roommate due to roommate leaving TV on at night and snoring,but states she is sleeping better on Trazodone. Seems highly motivated in sobriety. Going to 12 step meetings and attending PHP regularly.  Current Medications   Trazodone  50 mgrs QHS Effexor XR now increased to 150 mgrs QAM   MSE - alert, attentive, good eye contact, intermittently tearful, but affect improves as session progresses, states remains depressed but acknowledges mood is  better overall, no thought disorder, denies suicidal ideations, no homicidal ideations, no  Hallucinations , no delusions. 0x3.   Assessment- Patient improving gradually. Ruminative about family stress, and makes statements referencing perceived lack of family support and encouragement. Responds well to support, encouragement. At this time motivated in sobriety and remains sober and working on relapse prevention efforts . Tolerating medications well.  Plan- continue PHP. Patient encouraged to allow family meeting- as  [place], states she does not think they will come, but will try to coordinate a phone teleconference family meeting if possible. Continue medications as highlighted above- does not currently need scripts .  [name], MD

## 2014-06-30 NOTE — Psych (Signed)
[facility]  Shannon [name] [id number]    Date:  06/30/2014 Time:  11:00 AM - 12:30 PM  Group Topic/Focus:  Managing Feelings:   The focus of this group is to identify healthy ways to manage feelings. Therapist tied group topic to Shannon [name]'s high risk situation and processed healthy ways she can cope effectively.   Participation Level:  Active  Participation Quality:  Appropriate and Attentive  Affect:  Appropriate and Irritableregardingstressor  Cognitive:  Alert and Appropriate  Insight: Improving  Engagement in Group:  Engaged  Modes of Intervention:  Activity, Discussion, Problem-solving and Support  Additional Comments: Group topic was healthy management of emotions. Group began with a check in. Members reviewed handout on "Managing Feelings". Therapist discussed how feelings should be processed, not ignored or covered up. If you don't allow feelings to be expressed, they will lonely leak out in physical, emotional or mental symptoms. The guideline given was to recognize the feelings, express the feeling, clarify the feeling, explain the feeling, and accept the feeling. Shannon [name] talked about her frustration and feeling of helplessness related to her roommate who is inconsiderate of her to the point where she is not able to sleep because of the noise in the room. She attempted to address the issue at a house meeting but not nothing was resolved. She did talk to housemates and they were supportive. Therapist pointed out that that severe stress at house can be a trigger and her recovery includes finding healthy ways to cope. Therapist provided positive feedback that she used good coping in talking about it in group and  and talking about it with housemates who could be supportive. She was finding healthy ways to channel her emotions and getting through this situation will help to feel better about herself and her ability to cope successfully. Therapist needs to continue  to work with Shannon [name] to find ways to channel her anger and feelings toward healthy coping strategies. During group she was active in participation through input into discussion.   Suicidal/Homicidal: No  Plan:1.Shannon [name] continue to use the group for healthy management of emotions.2.Therapist encourage Shannon [name] to use healthy coping strategies.   [name],[name] 06/30/2014, 3:15 PM   [facility] BH PHP THERAPIST PROGRESS NOTE   Date:  06/30/2014 Time:  1:00 PM -2:00 PM  Group Topic/Focus:  Healthy Communication:   The focus of this group is to discuss communication in relation to the family, barriers to communication, as well as healthy ways to communicate within family system.  Participation Level:  Active  Participation Quality:  Appropriate and Attentive  Affect:  Depressed and Tearful  Cognitive:  Alert and Oriented  Insight: Improving  Engagement in Group:  Engaged  Modes of Intervention:  Discussion, Problem-solving and Support  Additional Comments: Shannon [name] reviewed communication patterns between her and her parents. Prior to her hospitalization she reviewed with therapist the exchange of words she had with her parents. The interchange is a dialogue that spirals negatively downward that led to Shannon [name] running away and coming back to Shannon [name]. Shannon [name] says that they point the responsibility for their responses back to Shannon [name] and Shannon [name] feels worthless and that everything is her fault. The chain of events may start off with Shannon [name] being hurt about something, expressing this by lashing out and then her Mom lashes back twice as hard. Shannon [name] will try to get her Mom to work things out but her Mom abandons her instead and doesn't try to work it out. Therapist pointed  out that their actions have an impact on how Shannon [name] feels about herself and her parents are not aware of what a negative impact that their words and behaviors have on Shannon [name]. My view is that a family meeting may be helpful to help the family have more healthy  ways to communicate. Shannon [name] may also need to consider detaching from the family that will help her gain more insight about unhealthy patterns and help her in forming her own healthy self-image.    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 Major depressive disorder, single episode, severe without psychotic features   2. Polysubstance (excluding opioids) dependence       [name],[name] 06/30/2014

## 2014-07-01 ENCOUNTER — Ambulatory Visit (HOSPITAL_COMMUNITY): Payer: BLUE CROSS/BLUE SHIELD | Admitting: Licensed Clinical Social Worker

## 2014-07-01 ENCOUNTER — Ambulatory Visit (HOSPITAL_COMMUNITY): Payer: Self-pay | Admitting: Licensed Clinical Social Worker

## 2014-07-01 DIAGNOSIS — F1920 Other psychoactive substance dependence, uncomplicated: Secondary | ICD-10-CM

## 2014-07-01 DIAGNOSIS — F322 Major depressive disorder, single episode, severe without psychotic features: Secondary | ICD-10-CM

## 2014-07-01 DIAGNOSIS — F1994 Other psychoactive substance use, unspecified with psychoactive substance-induced mood disorder: Secondary | ICD-10-CM

## 2014-07-01 DIAGNOSIS — F329 Major depressive disorder, single episode, unspecified: Secondary | ICD-10-CM | POA: Diagnosis not present

## 2014-07-01 NOTE — Psych (Signed)
   [facility]  Shannon [name] [id number]   Date:  06/30/14 Time:  4:30pm  Group Topic/Focus: Coping With Mental Health Crisis:   The purpose of this group is to help patients identify strategies for coping with mental health crisis.  Group discusses possible causes of crisis and ways to manage them effectively. Crisis Planning:   The purpose of this group is to help patients create a crisis plan for use upon discharge or in the future, as needed. Developing a Wellness Toolbox:   The focus of this group is to help patients develop a "wellness toolbox" with skills and strategies to promote recovery upon discharge. Emotional Education:   The focus of this group is to discuss what feelings/emotions are, and how they are experienced.  Participation Level:  Active  Participation Quality:  Attentive  Affect:  Anxious and Appropriate  Cognitive:  Alert and Appropriate  Insight: Appropriate and Improving  Engagement in Group:  Engaged  Modes of Intervention:  Activity, Clarification, Discussion, Education and Support  Additional Comments:  Today's group focused on defining balance in one's own words, identifying things that can knock one off balance, and exploring healthy ways to maintain balance in life. Group members were asked to provide an example of a time when they felt off balance, describe how they handled that situation, and process healthier ways to regain balance in the future. Group members were asked to share the most important tool for maintaining balance that they learned [facility] and how they plan to apply this method after discharge. Shannon [name] shared a poem she had memorized in high school "The Perfect High" by Melene MullerShel Silverstein. Therapist expressed concerns about this particular poem and explored Shannon [name]'s connection to the poem. Shannon [name] stated that she is very good and rote memorization. Therapist suggested she utilize this as a Associate Professorcoping skill to memorize poetry.  Therapist modeled how to utilize "spoken word" to deescalate during a panic attack by utilizing deep breathing between prose.   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 Major depressive disorder, single episode, severe without psychotic features   2. Polysubstance (excluding opioids) dependence       Shannon Krull, LCSW 06/30/14

## 2014-07-01 NOTE — Psych (Signed)
[facility]  Shannon [name] [id number]   Date:  07/01/2014 Time:  2:15 PM  Group Topic/Focus: Crisis Planning:   The purpose of this group is to help patients create a crisis plan for use upon discharge or in the future, as needed. Overcoming Stress:   The focus of this group is to define stress and help patients assess their triggers. Rediscovering Joy:   The focus of this group is to explore various ways to relieve stress in a positive manner.  Participation Level:  Active  Participation Quality:  Attentive  Affect:  Appropriate  Cognitive:  Alert  Insight: Good  Engagement in Group:  Engaged  Modes of Intervention:  Activity, Discussion, Education and Problem-solving  Additional Comments:  Patients were assisted in developing better coping skills and building confidence through their ability to manage stress related incidents. Cognitive Behavioral Therapy techniques were used to help patients identify, explore and develop a plan to change distorted and/or negative behaviors. Therapist encouraged patients to identify cognitive activities to build into their day. Patients were provided with examples of cognitive challenges to build into the day: including reading, playing instruments, writing poetry/books/songs, learning lyrics to a new song, puzzles, games, and keeping up with sports or other interests.  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 Major depressive disorder, single episode, severe without psychotic features   2. Polysubstance (excluding opioids)  dependence   3. Substance induced mood disorder       Shannon Ratcliffe, LCSW 07/01/2014             Adult Psychoeducational Group Note  Date:  07/01/2014 Time:  2:21 PM  Group Topic/Focus: Building Self Esteem:   The Focus of this group is helping patients become aware of the effects of self-esteem on their lives, the things they and others do that enhance or undermine their self-esteem, seeing the relationship between their level of self-esteem and the choices they make and learning ways to enhance self-esteem. Developing a Wellness Toolbox:   The focus of this group is to help patients develop a "wellness toolbox" with skills and strategies to promote recovery upon discharge. Recovery Goals:   The focus of this group is to identify appropriate goals for recovery and establish a plan to achieve them.  Participation Level:  Active  Participation Quality:  Appropriate  Affect:  Appropriate  Cognitive:  Alert  Insight: Good  Engagement in Group:  Engaged  Modes of Intervention:  Education, Exploration, Orientation and Support  Additional Comments:  Therapist discussed the positive impact of incorporating a healthy lifestyle to support their wellness goals. Patents were encouraged to engage in aerobic exercise, maintain a healthy diet, and to try to get adequate sleep. Patients were taught how to monitor sleep and to set a schedule to prepare for bedtime. Patients were educated in understanding how a healthy lifestyle can improve cognition. Therapist facilitated coordination with a yoga instructor to demonstrate implementation of healthy lifestyle behaviors.  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 Major depressive disorder, single episode, severe without psychotic features   2. Polysubstance (excluding opioids) dependence   3. Substance induced mood disorder             Shannon [name] 07/01/2014, 2:21 PM

## 2014-07-01 NOTE — Psych (Signed)
   [facility]  Sharion Settlerlla Capaldi [id number]    Date:  07/01/2014 Time:  2:30 PM-4:00 PM  Group Topic/Focus:  Mental Health Diagnosis Education  Participation Level:  Minimal  Participation Quality:  Appropriate and Attentive  Affect:  Euthymic  Cognitive:  Alert, Appropriate and Oriented  Insight: Improving  Engagement in Group:  Engaged  Modes of Intervention:  Discussion, Education and Exploration  Additional Comments: Group topic was on panic attacks and anxiety. The group read a handout about how panic attacks happen, how they develop and how they are maintained. Therapist focused on how a person's experience of stress can set the stage for a panic attack and led a discussion for patient's to connect this insight to their lives. [name] could see the connection and that she would have panic type symptoms related to school. She benefited from discussion in group about how patient's found ways of managing symptoms through techniques such as challenging catastrophic thinking, grounding, not focusing on internal symptoms, and breathing techniques. [name] was quiet during discussion but showed engagement through body language and active listening.   Suicidal/Homicidal: No  Plan:1.1.Patient continue to utilize coping strategies to help manage mental health diagnosis. 2.Therapist continue to educate client on strategies to manage her mental health diagnosis.   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 Major depressive disorder, single episode, severe without psychotic features   2. Polysubstance (excluding opioids) dependence             [name],[name]

## 2014-07-01 NOTE — Psych (Signed)
   [facility]  Sharion Settlerlla Rains [id number]  Session Time: 1.40pm-2.25pm  Participation Level: Active  Behavioral Response: Well GroomedAlertblunted  Type of Therapy: Activity Group  Treatment Goals addressed: Coping  Interventions: Other: yoga and mindfulness  Summary: Sharion Settlerlla Irby is a 24 y.o. female who presents with blunted affect.  Pt reported no experience w/ yoga.  Pt initially seemed hesitant to participate.  Pt throughout activity became more engaged.  Pt gave no response as to her experience.  Suicidal/Homicidal: Nowithout intent/plan  Therapist Response: Introduced yoga and provided psycho education as to benefits and assisting w/ building coping skills for training relaxation response.  Led group through Orthoptistgrounding chair class.  Focused on intention of noticing grounding and supported by earth and focusing on either movement, sensation or breath.  Processed effects for each member.  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 Major depressive disorder, single episode, severe without psychotic features   2. Polysubstance (excluding opioids) dependence   3. Substance induced mood disorder        Forde RadonLeanne Yates, [facility] 07/01/14

## 2014-07-02 ENCOUNTER — Other Ambulatory Visit (HOSPITAL_COMMUNITY): Payer: BLUE CROSS/BLUE SHIELD | Admitting: Licensed Clinical Social Worker

## 2014-07-02 DIAGNOSIS — F1920 Other psychoactive substance dependence, uncomplicated: Secondary | ICD-10-CM

## 2014-07-02 DIAGNOSIS — F329 Major depressive disorder, single episode, unspecified: Secondary | ICD-10-CM | POA: Diagnosis not present

## 2014-07-02 DIAGNOSIS — F322 Major depressive disorder, single episode, severe without psychotic features: Secondary | ICD-10-CM

## 2014-07-02 NOTE — Psych (Signed)
   [facility]  Shannon [name] [id number]  [facility]  Date: 07/02/14  Time: 11:00 AM-12:30 PM   Group Topic/Focus:  The focus of this group is to define stress and help patients assess their triggers.  Participation Level:  Active  Participation Quality:  Appropriate and Attentive  Affect:  Appropriate  Cognitive:  Alert and Appropriate  Insight: Appropriate  Engagement in Group:  Engaged  Modes of Intervention:  Discussion, Problem-solving and Support  Additional Comments: Group Topic for today was coping strategies for stressors. Group started with a check in. Shannon [name] had related that she finally heard back from her parents. She has been struggling financially and her parents are willing to be involved in treatment so patient said to go ahead and schedule a family meeting by phone. Therapist provided supportive and positive feedback about their response. One of Shannon [name]'s main stressors is conflict and lack of communication with her family. Therapist continues to address this with Shannon [name] though interventions that are supportive, focus on ego support and strengths, and help in changing current dysfunctional patterns. Shannon [name] is quiet in group but shows engagement through body language that indicates active listening and responses when prompted by therapist.  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    [facility] PHP THERAPIST GROUP NOTE  Date:06/2714 Time: 1:73M-2:00 PM  Group Topic/Focus:  CBT-Effective Strategies for Management of Emotions-CBT strategies were applied to panic attacks  Participation Level:  Active  Participation Quality:  Appropriate and Attentive  Affect:  Appropriate  Cognitive:  Alert  and Appropriate  Insight: Improving  Engagement in Group:  Improving  Modes of Intervention:  Activity, Discussion and Education  Additional Comments: The group topic was managing emotions and in particular panic. Patient reviewed and discussed a handout "The Thinking-Feeling Connection". Therapist explained that thinking responses and the types of thoughts we have play a large role in panic disorder. While we assume that situations "make Shannon [name] feel" a particular emotion, there is actually step in between. Our thoughts influence our feelings. The same situation can be perceived in different ways, leading to different emotional experiences. Therapist pointed out particular thoughts that can lead to panic. Patients learned that types of thoughts also can play a role in other mental health symptoms such as depression. Patients were encouraged to pause and recognize automatic thoughts that are negative are usually unhelpful thoughts. Shannon [name] showed good engagement in activity of role playing to help make the connection between how thoughts influence emotions. Therapist needs to continue to work with Shannon [name] on healthy strategies to manage her emotions. She was quiet in group but participated appropriately when prompted by therapist.   Suicidal/Homicidal: No  Plan: 1.Patient will utilize suggestions from this exercise of identifying unhelpful thoughts that will help in management of her emotions.2.Patient will implement CBT strategies in life situations to help with anxiety and in general her emotions.   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 Major depressive disorder, single episode, severe without psychotic features   2. Polysubstance (excluding opioids) dependence       Shannon [name] A 07/02/2014

## 2014-07-03 ENCOUNTER — Other Ambulatory Visit (HOSPITAL_COMMUNITY): Payer: BLUE CROSS/BLUE SHIELD | Admitting: Licensed Clinical Social Worker

## 2014-07-03 ENCOUNTER — Ambulatory Visit (HOSPITAL_COMMUNITY): Payer: Self-pay | Admitting: Licensed Clinical Social Worker

## 2014-07-03 DIAGNOSIS — F329 Major depressive disorder, single episode, unspecified: Secondary | ICD-10-CM | POA: Diagnosis not present

## 2014-07-03 DIAGNOSIS — F322 Major depressive disorder, single episode, severe without psychotic features: Secondary | ICD-10-CM

## 2014-07-03 DIAGNOSIS — F1920 Other psychoactive substance dependence, uncomplicated: Secondary | ICD-10-CM

## 2014-07-03 NOTE — Psych (Signed)
   [facility]  Shannon Settlerlla Goers [id number]   Date:  07/02/14 Time:  4:00pm  Group Topic/Focus: Recovery Goals:   The focus of this group is to identify appropriate goals for recovery and establish a plan to achieve them.Relapse Prevention Planning:   The focus of this group is to define relapse and discuss the need for planning to combat relapse.  Participation Level:  Active  Participation Quality:  Appropriate  Affect:  Appropriate  Cognitive:  Alert  Insight: Good  Engagement in Group:  Engaged  Modes of Intervention:  Activity, Discussion, Education and Exploration  Additional Comments:   Group members were taught and asked to discuss their problem-solving skills related to stress. Patients were asked to define a source of on-going stress clearly, brainstorm multiple solutions, listing the pros and cons of each solution, seeking input from others, selecting and implementing a plan of action, and evaluating and readjusting the outcome. Patients were aided in developing a clear understanding of the use of the problem-solving skills. These new skills were modeled and demonstrated this through activities.  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 Major depressive disorder, single episode, severe without psychotic features   2. Polysubstance (excluding opioids) dependence       Xochil Shanker, LCSW 07/03/2014

## 2014-07-03 NOTE — Psych (Signed)
[facility]  Shannon [name] [id number]   Date:07/03/14   Time:11:00 AM-12:30 PM  Group Topic/Focus:  The focus of this group is to provide creative interventions to help patients work on building self-esteem   Participation Level:  Active  Participation Quality:  Appropriate and Attentive  Affect:  Appropriate  Cognitive:  Alert and Appropriate  Insight: Improving  Engagement in Group:  Improving  Modes of Intervention:  Activity, Discussion and Exploration  Additional Comments: Group topic was on self-esteem. Group started with check in. Therapist and group members shared through music and poetry creative ways to express that self-esteem or one's value is unconditional and comes not through external standards but through the value we have from being uniquely ourselves. Shannon [name] talked about the texts that she has been sending her Mom. She has told her Mom that she is the reason she has suicidal thoughts, no sense of self-worth and if Shannon [name] was a different person her Mom would not have treated her this way. Her Mom response has been to say that she did not realize that she was that awful. This has made Shannon [name] feel bad because she has caused her Mom to feel awful when all she is asking from her Mom is recognize what she needs. My impression is that it is clear that there are negative communication patterns where each person does not feel heard. It would be helpful for the family to realize what the impact their communication is having on each other and a better sense of what each person needs. The team will attempt to open up dialogue next week with family session by phone. Shannon [name] had active engagement through active listening and participation through sharing during group.    Suicidal/Homicidal: No  Plan: 1.Shannon [name] continue to work on self-esteem.2.Shannon [name] work in treatment to address negative communication patterns of the family.    [facility]  Date:  07/03/14  Time: 1:00 PM-2:00 PM  Group Topic/Focus:  The focus of this group is to utilize group input to identify effective coping strategies for stress and emotions such as anger. The group also discussed one of the stressors that each shared related to problems they experienced in their family relationships.  Participation Level:  Active  Participation Quality:  Appropriate and Attentive  Affect:  Appropriate, Irritable and Euthymic  Cognitive:  Alert and Appropriate  Insight: Improving  Engagement in Group:  Engaged  Modes of Intervention:  Activity, Discussion, Education and Exploration  Additional Comments: Group topic was on helpful ways to release emotions in constructive ways such as anger. All group members had a consensus that music was the most helpful coping strategy for emotions such as anger. Each of the group members shared a song and explained why this song was one that was effective for them in releasing anger. This discussion evolved to problems in family relationships and therapist was able to help each member relate to one another because they each shared the issue of not feeling emotionally supported by parents. This helped the group be a supportive setting for group members to talk about problems they experienced with family relationships. Shannon [name] shared a song that helped her manage her anger as well as shared a five minute poem she memorized related to how the ultimate high comes not from a drug but finding the answers within oneself. She has irritability at times but good participation in group.   Suicidal/Homicidal: no  Plan: 1.Shannon [name] continue to learn and  apply effective strategies to manage her emotions.2.Theapist provide insight to effective coping strategies for emotions and supportive interventions.   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 Major depressive disorder, single episode,  severe without psychotic features   2. Polysubstance (excluding opioids) dependence       [name],[name] 07/03/2014

## 2014-07-04 ENCOUNTER — Ambulatory Visit (HOSPITAL_COMMUNITY): Payer: Self-pay | Admitting: Licensed Clinical Social Worker

## 2014-07-04 ENCOUNTER — Other Ambulatory Visit (HOSPITAL_COMMUNITY): Payer: BLUE CROSS/BLUE SHIELD | Admitting: Licensed Clinical Social Worker

## 2014-07-04 VITALS — Ht 66.0 in | Wt 124.8 lb

## 2014-07-04 DIAGNOSIS — F1920 Other psychoactive substance dependence, uncomplicated: Secondary | ICD-10-CM

## 2014-07-04 DIAGNOSIS — F322 Major depressive disorder, single episode, severe without psychotic features: Secondary | ICD-10-CM

## 2014-07-04 DIAGNOSIS — F329 Major depressive disorder, single episode, unspecified: Secondary | ICD-10-CM | POA: Diagnosis not present

## 2014-07-04 NOTE — Psych (Signed)
   [facility]  Sharion Settlerlla Addo [id number]    Date:  07/03/14 Time:  4:40pm  Group Topic/Focus: Conflict Resolution:   The focus of this group is to discuss the conflict resolution process and how it may be used upon discharge.  Healthy Communication:   The focus of this group is to discuss communication, barriers to communication, as well as healthy ways to communicate with others. Self Care:   The focus of this group is to help patients understand the importance of self-care in order to improve or restore emotional, physical, spiritual, interpersonal, and financial health. Stages of Change:   The focus of this group is to explain the stages of change and help patients identify changes they want to make upon discharge.  Participation Level:  Active  Participation Quality:  Attentive and Sharing  Affect:  Depressed  Cognitive:  Alert  Insight: Good  Engagement in Group:  Engaged  Modes of Intervention:  Activity, Confrontation, Discussion, Education, Exploration, Problem-solving and Support  Additional Comments:  [name] has requested a family meeting to discuss some of the conflict between herself and her parents. She gave insight into her family dynamic and describes herself as the family's "black sheep" and describes her older sister's "the [place] Child". She states her relationship with her parents is awful and states this is more apparent with her mother than with her father. She describes her dad as "business like" and states he comes into a situation ready to "fix" it. She states that she feels they do not care about her and that she does not like to talk about them because thinking abut their reactions to her emotions upsets her.   Suicidal/Homicidal: Negativewithout intent/plan  Therapist Response: Group members were asked to explore their feelings about getting closer and developing a better relationship with parents. Group members were asked to discuss the  pros and cons of a more cohesive family from each member's perspective. Distorted and negative perceptions were processed and feedback was offered.  Members were encouraged to share insight into their family dynamics and were assisted with identifying underlying feelings and emotions which have led to negative behaviors in the past. Feelings of fear, anger, hurt, or depression that may contribute to family tension and/or conflict were explored. Potential pitfalls that may contribute to backsliding into poor communication and disengagement were monitored and discussed. Brainstorming possible causes of family member to slide back into old habits of ineffective communication (belittling, negative comments, labeling, etc.) were discussed and conflict resolution strategies were developed.  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 Major depressive disorder, single episode, severe without psychotic features   2. Polysubstance (excluding opioids) dependence       Areliz Rothman, LCSW 07/03/14

## 2014-07-04 NOTE — Psych (Signed)
[facility] PHP THERAPIST PROGRESS NOTE  Shannon [name] [id number]   Date: 07/04/14 Time: 11:00 AM-12:30 PM   Group Topic/Focus:  Coping Strategies-Group was Introduced to Shannon [name] as an Network engineer to Manage Intense Negative Emotions  Participation Level:  Active  Participation Quality:  Appropriate, Attentive and Sharing  Affect:  Appropriate  Cognitive:  Oriented  Insight: Improving  Engagement in Group:  Engaged  Modes of Intervention:  Discussion and Exploration  Additional Comments: Group topic was on grounding. Group began with [name] check in. The patients were taught the concept of grounding that helps reduce overwhelming feelings. By moving from [name] high to [name] middle or low range of negative feelings, they will be more abel to cope successfully. Therapist explained that the grounding is [name] way of centering so that [name] person is externally focused and in the present moment. Shannon [name] related that she felt that it was an effective technique and gave an example of using it when angry with room mate and went to the pottery room and made [name] cup. Therapist feedback was to point out her ability to use coping strategies to manage emotions and she agreed. She will be encouraged in her ability to use healthy coping as reinforcement and to help her see the positive benefits to using healthy coping strategies.   Suicidal/Homicidal: No  Plan:1.Shannon [name] will use grounding and other healthy coping strategies as to manage intense emotions rather than unhealthy alternatives such as substance use.2.Therapist continue to support and educate Shannon [name] on healthy coping strategies to manage emotions.  [facility] BH PHP THERAPIST GROUP NOTE  Date:07/04/14   Time: 1:00 PM-2:00 Pm    Group Topic/Focus:  Group Topic was on Healthy Separation from Parents and Identify Formation. Group also discussed how healthy managment of emotions means experssing emotions and not repressing them.   Participation Level:   Active  Participation Quality:  Appropriate, Attentive and Withdrawing  Affect:  Appropriate, Irritable and quiet  Cognitive:  Alert and Appropriate  Insight: Improving  Engagement in Group:  Engaged and Limited-regarding topic of relationship  Modes of Intervention:  Discussion and Education  Additional Comments: Group topic was on healthy separation and identify formation. Patients were assisted in understanding that separating from parents and finding their own direction in life was [name] healthy process but also there could be obstacles that made things more difficult. On their parents side, they might not be able to give up their idealization of what they want for their child. [name] healthy behavior for parents is to allow their child to separate and to develop their independence. This topic helps to support Shannon [name] in figuring out what she wants for herself and also to understand that this can take time and be trial and error. In fact, learning about ourselves is [name] life long process since we are always changing. Shannon [name] participated actively in group actively and appropriately. She did get news in group about her exboyfriend and she became quiet and admitted that it "annoyed and frustrated her". Therapist encouraged her gain perspective by looking at his history and recognizing that her life would continue to spiral downward if involved with him. Therapist discussed how repressing feelings is not healthy way to manage emotions and leads to problems and surfacing of emotions in unhealthy ways. Shannon [name] did not open up more about her ex boyfriend so therapist provided supportive and insight oriented interventions. She will be encouraged to process her feelings about her ex-boyfriend for healthy management of emotions.  Suicidal/Homicidal: No  Plan:1.Shannon [name] utilize group for healthy processing of emotions.2.Therapst help Shannon [name] in processing through emotions and gaining insight to healthy relationships.                            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 Major depressive disorder, single episode, severe without psychotic features   2. Polysubstance (excluding opioids) dependence       Shannon [name] 07/04/2014

## 2014-07-04 NOTE — Psych (Signed)
D. Patient presented with appropriate affect, pleasant and level mood and stated she was doing "better".  Patient reported she was sleeping better as Dr. Jama Flavorsobos increased her Trazodone to 100mg , 1 at bedtime now and was doing more with house mates.  A. Patient stated her appetite had improved too but admitted she was a little anxious about the family session she was set to have on 07/08/14 with PHP counselors and her parents. Patient with no suicidal or homicidal ideations, reports depression improving but still trying to learn what to do with time and life changes she has undergone since stopping substances and withdrawing from school.  Patient reported her depression was at a 5, her anxiety at a 5 and her hopelessness at a 3 on a scale of 0-10 with 0 being the lowest she could experience and 10 the most.  Agreed not to do the PHQ9 scale with patient today as she admits when she gets asked a lot of questions it brings things back up and makes her more depressed.  Patient leaving early today due to she is the only one attending PHP today.  Patient stable and overall appearance less anxious and more focused.  Patient agreed to work with Towanda MalkinMary Alice, counselor to prepare questions and concerns she wants to address with family in the session she has scheduled for 07/08/14 and states they have already been working on this as well.  Patient with no current complaints or concerns and [facility]  Patient to call or contact this nurse as needed. Mood level and no SI/HI at this time and no report of symptoms or side effects to current medication.

## 2014-07-07 ENCOUNTER — Other Ambulatory Visit (HOSPITAL_COMMUNITY): Payer: BLUE CROSS/BLUE SHIELD | Attending: Psychiatry | Admitting: Licensed Clinical Social Worker

## 2014-07-07 DIAGNOSIS — F329 Major depressive disorder, single episode, unspecified: Secondary | ICD-10-CM | POA: Diagnosis present

## 2014-07-07 DIAGNOSIS — F1220 Cannabis dependence, uncomplicated: Secondary | ICD-10-CM | POA: Diagnosis not present

## 2014-07-07 DIAGNOSIS — F1620 Hallucinogen dependence, uncomplicated: Secondary | ICD-10-CM | POA: Diagnosis not present

## 2014-07-07 DIAGNOSIS — F1920 Other psychoactive substance dependence, uncomplicated: Secondary | ICD-10-CM

## 2014-07-07 DIAGNOSIS — F1320 Sedative, hypnotic or anxiolytic dependence, uncomplicated: Secondary | ICD-10-CM | POA: Insufficient documentation

## 2014-07-07 DIAGNOSIS — F322 Major depressive disorder, single episode, severe without psychotic features: Secondary | ICD-10-CM

## 2014-07-07 NOTE — Psych (Signed)
[facility] BH PHP THERAPIST PROGRESS NOTE  Shannon [name] [id number]   Date: 07/06/14   Time: 11:00 AM -12:00 PM  Group Topic/Focus:  Building Self Esteem:   The Focus of this group is helping patients of things they do to enhance their self-esteem and learning ways to enhance self-esteem.  Participation Level:  Active  Participation Quality:  Appropriate and Attentive  Affect:  Appropriate  Cognitive:  Alert and Appropriate  Insight: Improving  Engagement in Group:  Engaged  Modes of Intervention:  Discussion, Education and Exploration  Additional Comments: Group topic today was on ways to build self-esteem. Group started with [name] check in. Therapist discussed some of the ways to build self-esteem include developing [name] wellness plan that integrates mind, body and spirt, self-nurturing activities, self-talk and affirmations and [name] sense of accomplishment. Shannon [name] enjoyed some activities this weekend and therapist pointed out that one of her strengths is the ability to enjoy activities on her own. This ability to have positive experiences that are not dependent on others can be [name] source of self-esteem and self regulation. Shannon [name] reported that she has 30 days clean and plans to reward herself with treats and watching [name] favorite show. This is part of her recovery plan where she rewards herself for achievements and also is [name] self-nurturing activities. Shannon [name] needs to continue to work on building self-esteem in making progress in treatment.   Suicidal/Homicidal: no  Plan: 1.Shannon [name] continue to take steps to enhance self-esteem.2.Therapist provide supportive interventions and educate Shannon [name] on ways to build self-esteem.   [facility] BH PHP THERAPIST GROUP NOTE  Date: 07/06/14 Time: 1:00 PM-2:00 PM   Group Topic/Focus:  Group Topic-Understanding Some of the Objectives of Family Therapy and Applying this Insight to Patients Own Family System  Participation Level:  Active  Participation Quality:  Appropriate and  Attentive  Affect:  Appropriate  Cognitive:  Alert and Appropriate  Insight: Improving  Engagement in Group:  Engaged  Modes of Intervention:  Discussion, Education and Exploration  Additional Comments:  The group topic was on some of the useful purposes of family therapy. Therapist explained that one of the problems that needs addressed among family members is that there is no direct and clear communication among family members; rater people tend to talk through other people(they triangulate). The therapist becomes [name] coach who teaches clients effective interactional patterns. Therapist pointed out that within Shannon [name]'s family there appears to be unhealthy communication patterns and behaviors. They need to be replaced with healthier communication patterns where they are not hurting each other and are able to hear each other in order to get their needs met. Shannon [name] explains that communication with her Mom goes to [name] feeling like it is her fault. She was encouraged to prepare for family session to make sure she is able to say what she needs to say. Shannon [name] relates that her main objective is that she is working on her part but would like her Mom to get help and work on her part of problems in their relationship. My clinical opinion was that it is helpful to understand that one of the goals of family therapy is to establish healthier communication among family members and to think about what she wants to say at family sessions.   Suicidal/Homicidal: no  Plan: 1.Shannon [name] take steps in creating healthier communication with family members.2.Therapist build insight to how to take positive steps in creating [name] healthier family system.   [facility] BH PHP THERAPIST GROUP NOTE  Date: 07/06/14  Time: 2:00 Pm-3:00 PM    Group Topic/Focus:  Recovery Goals:   The focus of this group was to discussion some of the underlying causes of addiction and by uncovering these insights find ways to support someone in addiction and to  identify pathways to healing.   Participation Level:  Active  Participation Quality:  Appropriate and Attentive  Affect:  Appropriate  Cognitive:  Alert and Appropriate  Insight: Improving  Engagement in Group:  Engaged  Modes of Intervention:  Activity, Discussion, Education and Support  Additional Comments: Patient watched and discussed Shannon [name] video titled "Everything You Want to Know About Addiction is Wrong". The speaker of the video talked about what works in order to help an addict is not to punish an addict but rather showing support and caring. Patient agrees that [name] significant cause of addiction is disengagement and the remedy is through social engagement and finding [name] purpose. She said that it was frustrating that more people don't see underlying causes and effective ways that will help someone with addiction. It was helpful for her to express some of the ways she feels frustrated by people's lack of understanding and also to agree with the speaker in the video of ways you can help someone with an addiction. The discussion was also [name] helpful guide for her own recovery in that healing would involve finding purpose and connection.   Suicidal/Homicidal: no  Plan:1.Therapist continue to provide insight and education about effective strategies in recovery.2.Shannon [name] apply strategies to help her make progress in her recovery.    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 Major depressive disorder, single episode, severe without psychotic features   2. Polysubstance (excluding opioids) dependence       Shannon [name],Shannon [name] 07/07/2014

## 2014-07-08 ENCOUNTER — Other Ambulatory Visit (HOSPITAL_COMMUNITY): Payer: BLUE CROSS/BLUE SHIELD | Admitting: Psychiatry

## 2014-07-08 ENCOUNTER — Ambulatory Visit (HOSPITAL_COMMUNITY): Payer: Self-pay | Admitting: Licensed Clinical Social Worker

## 2014-07-08 DIAGNOSIS — F322 Major depressive disorder, single episode, severe without psychotic features: Secondary | ICD-10-CM

## 2014-07-08 DIAGNOSIS — F1920 Other psychoactive substance dependence, uncomplicated: Secondary | ICD-10-CM

## 2014-07-08 DIAGNOSIS — F1994 Other psychoactive substance use, unspecified with psychoactive substance-induced mood disorder: Secondary | ICD-10-CM

## 2014-07-08 DIAGNOSIS — F329 Major depressive disorder, single episode, unspecified: Secondary | ICD-10-CM | POA: Diagnosis not present

## 2014-07-08 NOTE — Psych (Signed)
   [facility]  Shannon [name] [id number]  [facility] Date: 07/08/14 Time: 11:00 AM-12:00 PM   Group Topic/Focus:  Mental Health Diagnosis and Progress in Symptom Management  Participation Level:  Minimal  Participation Quality:  Drowsy and Inattentive  Affect:  Lethargic  Cognitive:  Lacking  Insight: Limited  Engagement in Group:  Poor  Modes of Intervention:  Discussion and Exploration  Additional Comments: Group topic mental health diagnosis and progress in symptom management. Group began with a check in. Group members talked about their specific diagnosis and what has and has not been effective in helping to manage. Therapist related that one is making progress in recognizing negative behaviors because you recognize that this is how you are coping and that it is not helping. By identifying what is not helping, you can begin to work on changing your strategies to find more effective strategies to cope. Shannon [name] lacked participation in group and reported that she did not sleep, forgot to take her medication in time and was drowsy. She did say she came to treatment to work on changing her attitude about things and how she looks at it and she has been working on herself and she does notice a change. Shannon [name] has a family meeting by phone later and some of her behavior in group may be related to preoccupation with this meeting.  Suicidal/Homicidal: No  Plan:1.Therapist work with Shannon FredericElla on finding insights to help in attitude and behavior change.2.Shannon [name] develop effective coping strategies in conjunction with attitude and behavior change.    [facility] BH PHP THERAPIST GROUP NOTE  Date: 07/08/14  Time: 12:30 PM-2:00 PM   Group Topic/Focus:  Group topic is Distress Tolerance and Recognizing Unhelpful Escape Methods Used to Manage Distressing Emotions  Participation Level:  Minimal  Participation Quality:  Drowsy and Inattentive  Affect:   Lethargic  Cognitive:  Lacking  Insight: Limited  Engagement in Group:  Lacking  Modes of Intervention:  Activity, Discussion and Education  Additional Comments: The group topic was on distress tolerance. The group read a handout on "Understanding Distress Intolerance". The group discussed what distress tolerance means and how the more we fear, struggle with and try to avoid any form of distress generally the worse that distress gets. They discussed how distress tolerance develops, and distress tolerance escape methods. Patients identified their own distress intolerance beliefs and identified for each of the escape methods a healthy way to deal with stress and uncomfortable emotions. Patients shared how dealing with distress is related to their recovery process and healthy coping techniques.Shannon [name] lacked and engagement in group discussion. She continued to show behaviors of being drowsy and lethargic.   Suicidal/Homicidal: No  Plan:1.[name] work on changing unhelpful coping strategies to manage distress and implement healthier strategies 2.Therapist continue to educate patient on distress tolerance module.     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 Major depressive disorder, single episode, severe without psychotic features   2. Polysubstance (excluding opioids) dependence       [name] A 07/08/2014

## 2014-07-08 NOTE — Psych (Signed)
   [facility]  Sharion Settlerlla Garay [id number]   Date:  07/08/2014 Time:  5:36 PM  Group Topic/Focus: Dimensions of Wellness:   The focus of this group is to introduce the topic of wellness and discuss the role each dimension of wellness plays in total health.  Emotional Education:   The focus of this group is to discuss what feelings/emotions are, and how they are experienced. Identifying Needs:   The focus of this group is to help patients identify their personal needs that have been historically problematic and identify healthy behaviors to address their needs. Managing Feelings:   The focus of this group is to identify what feelings patients have difficulty handling and develop a plan to handle them in a healthier way upon discharge.  Participation Level:  Active  Participation Quality:  Attentive  Affect:  Anxious  Cognitive:  Alert  Insight: Appropriate  Engagement in Group:  Engaged and Limited  Modes of Intervention:  Clarification, Discussion, Education, Exploration, Problem-solving and Support  Additional Comments:  Therapist noted that during discussions many of the patients made decisions based on lack of confidence, low self-esteem, reality misperceptions and low energy. Therapist assessed for signs of social withdrawal among patient comments, affect, and group interactions. Therapist encouraged patients to identify and verbalize positive affirmation statements about themselves. Therapist assisted patients and solicited input from other group members when a member appeared reluctant, despondent or incapable of verbalizing positive statements about themselves. Therapist taught patients coping skills to help increase verbalization of positive self-statements and facilitated role-play and practice among group members.  Therapist focused session on exploring, processing, identifying and reframing self-defeating interpersonal, social and environmental patterns and  beliefs.   Suicidal/Homicidal: Negativewithout intent/plan  Therapist Response: Therapist noted that [name] was anxious bout the family meeting scheduled later today. Therapist assisted her with developing a list of questions so she will be pre-prepared for the family meeting. Therapist co-facilitated the meeting with the other PHP therapist, PHP psychiatrist, Patient and her family re,otely by phone. Therapist offered suggested to both El MaceroElla and her parents to facilitate better communication between them. Therapist noted that Nelly Laurencellla is extremely negative and hard on herself and tends to mind-read her parents feelings about her. Therapist was able to help Ellla to constructively express her anger to her parents and to offer suggestions to improve family communication patterns. Therapist suggested referral to a family therapist upon discharge. All family members agreed to family therapy.   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 Major depressive disorder, single episode, severe without psychotic features   2. Polysubstance (excluding opioids) dependence   3. Substance induced mood disorder       Reni Hausner, LCSW 07/08/2014

## 2014-07-09 ENCOUNTER — Other Ambulatory Visit (HOSPITAL_COMMUNITY): Payer: Self-pay

## 2014-07-09 NOTE — Progress Notes (Signed)
07/08/14  Family Therapy Session Duration - one hour  Attendants- Therapists ( Danielle and Towanda MalkinMary Alice , Clinical research associatewriter, patient, [place] student, and patient's parents via conference call)   Patient had requested a family meeting to discuss family dynamics and issues . [place] and it was difficult for them to come in person, but parties agreed to phone conference. Shannon [name] remains sober, and in fact just picked up her 30 day sobriety NA chip earlier this week. She has continued to struggle with some depression, but in general has been doing much better than prior to admission. Shannon [name] has identified family stressors and " communication issues" a  Major issue for her .   During meeting communication patterns were explored and reviewed. Both Shannon [name] and her parents identified episodes where arguments about relatively minor issues escalate in emotion and where increasingly critical, negative statements are made. For example, patient reported a situation where mother and her were " yelling at eachother, and I said, well maybe I should just die then, and mom said, go ahead and do it". Father stated he can find himself in a situation where he feels he needs to intervene in these arguments, which are more frequently between patient and mother. He states he feels the need to defend mother against Shannon [name]' anger sometimes . Shannon [name] described a subjective feeling that she is " to blame for everything that goes wrong in the family, and that her parents view her as a failure". Parents were supportive and made attempts to correct this perception , stating they realized she had many ego strengths and positives, such as intelligence, creativity, and " an awesome memory". We reviewed techniques to improve communication patterns which emphasize more  positive reinforcements  And less criticisms, and  Strategies to prevent escalation of arguments .  They all agreed that they love each other very much and that they regret the  hurtful, hypercritical comments made in the heat of arguments. Overall the meeting was positive, and Shannon [name] seemed to have been able to transmit to her parents her feelings and emotions regarding previous negative interactions and benefited from parents' willingness to acknowledge and express a desire to work on improving their communication patterns. Family Therapy recommended as well.  Nehemiah MassedFernando Cobos, MD

## 2014-07-10 ENCOUNTER — Ambulatory Visit (HOSPITAL_COMMUNITY): Payer: Self-pay | Admitting: Licensed Clinical Social Worker

## 2014-07-10 ENCOUNTER — Other Ambulatory Visit (HOSPITAL_COMMUNITY): Payer: BLUE CROSS/BLUE SHIELD | Admitting: Psychiatry

## 2014-07-10 DIAGNOSIS — F1920 Other psychoactive substance dependence, uncomplicated: Secondary | ICD-10-CM

## 2014-07-10 DIAGNOSIS — F322 Major depressive disorder, single episode, severe without psychotic features: Secondary | ICD-10-CM

## 2014-07-10 DIAGNOSIS — F1994 Other psychoactive substance use, unspecified with psychoactive substance-induced mood disorder: Secondary | ICD-10-CM

## 2014-07-10 DIAGNOSIS — F329 Major depressive disorder, single episode, unspecified: Secondary | ICD-10-CM | POA: Diagnosis not present

## 2014-07-10 NOTE — Psych (Signed)
   [facility]  Sharion Settlerlla Ironside [id number]    Date:  07/10/2014 Time:  5:30 PM  Group Topic/Focus: Conflict Resolution:   The focus of this group is to discuss the conflict resolution process and how it may be used upon discharge. Healthy Communication:   The focus of this group is to discuss communication, barriers to communication, as well as healthy ways to communicate with others. Managing Feelings:   The focus of this group is to identify what feelings patients have difficulty handling and develop a plan to handle them in a healthier way upon discharge. Personal Choices and Values:  The focus of this group is to help patients assess and explore the importance of values in their lives, how their values affect their decisions, how they express their values and what opposes their expression.  Participation Level:  Active  Participation Quality:  Appropriate and Sharing  Affect:  Anxious, Appropriate, Depressed, Irritable and Resistant  Cognitive:  Alert  Insight: Limited  Engagement in Group:  Developing/Improving and Engaged  Modes of Intervention:  Discussion, Education, Exploration, Problem-solving and Support  Additional Comments:  [name] feels family therapy is pointless and that nothing registers with her parents. [name] feels that her father does not listen to her and he is "only making things worse" between she and her mom.    Suicidal/Homicidal: Negativewithout intent/plan  Therapist Response: Therapist educated her on the process of family therapy. Therapist suggested that either [name] or the family insist on family therapy in stages so that [name] will not feel overwhelmed by the need to "fix" her family. Therapist aided [name] in understanding that her father may have issues that are a result of his upbringing which will need to be sorted out by a licensed professional and that she is not responsible for "fixing" him. Therapist addressed Olla's concerns about  his inability to change. Therapist connected Kyndra's own major change with abstinence from substance use as an example that change can happen. Therapist also addressed concerns about the family dynamic and aided [name] in accepting some of the responsibility for her part of the family's dysfunctional patterns. Therapist aided her in discovering the qualities she loves and admires most about her family. Therapist encouraged her to look for good qualities and connected positive aspects of how her family is currently supportive of her while she is in treatment.   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 Major depressive disorder, single episode, severe without psychotic features   2. Polysubstance (excluding opioids) dependence   3. Substance induced mood disorder       Amandalee Lacap, LCSW 07/10/2014

## 2014-07-10 NOTE — Psych (Unsigned)
   [facility]  Shannon Settlerlla Arif [id number]   Date:  07/10/2014 Time:  5:20 PM  Group Topic/Focus:  {BHH Group Topics/Focuses:20546}  Participation Level:  {BHH PARTICIPATION NWGNF:62130}LEVEL:22264}  Participation Quality:  {BHH PARTICIPATION QUALITY:22265}  Affect:  {BHH AFFECT:22266}  Cognitive:  {BHH COGNITIVE:22267}  Insight: {BHH Insight2:20797}  Engagement in Group:  {BHH ENGAGEMENT IN GROUP:22268}  Modes of Intervention:  {BHH MODES OF INTERVENTION:22269}  Additional Comments:  ***  Vartan Kerins 07/10/2014, 5:20 PM   Session Time: ***  Participation Level: {BHH PARTICIPATION LEVEL:22264}  Behavioral Response: {Appearance:22683}{BHH LEVEL OF CONSCIOUSNESS:22305}{BHH MOOD:22306}  Type of Therapy: {[facility] AMB BH Type of Therapy:21022741}  Treatment Goals addressed: {[facility] AMB BH Treatment Goals Addressed:21022754}  Interventions: {[facility] AMB BH Type of Intervention:21022753}  Summary: Shannon [name] is a 24 y.o. female who presents with ***.   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 Major depressive disorder, single episode, severe without psychotic features   2. Polysubstance (excluding opioids) dependence   3. Substance induced mood disorder       Angellynn Kimberlin, LCSW 07/10/2014

## 2014-07-10 NOTE — Psych (Signed)
[facility]  Shannon [name] [id number]   Date: 07/10/14   Time: 11:00-12:30 PM  Group Topic/Focus:  How family dynamics impact symptoms  Participation Level:  Active  Participation Quality:  Sharing  Affect:  Depressed, Irritable and Calm  Cognitive:  Alert and Appropriate  Insight: Limited  Engagement in Group:  Engaged and Resistant  Modes of Intervention:  Discussion, Exploration and Support  Additional Comments: Group topic today was on how the family dynamic impacts patients symptoms. Group started with a check in. Shannon [name] had had a meeting with treatment team and family by phone and she related her reaction. She said that the message she got from the meeting was that she deserved to hear how she negatively impacts her parents even though it "crushes her soul". Therapist related her perspective was that her dad seemed very logical and that there is family  miscommunication because he probably does is not good at understanding emotional communication and that is why she doesn't feel she is being heard. Shannon [name] feels family therapy is pointless and that nothing registers with her parents. Therapist also explained that it is not up to [facility] CenterElla to fix the dynamic but for the professional to help bring insight into the unhealthy dynamic. Just as therapist is pointing some of the benefits of family therapy, therapist is also working to bring insight to what is going on with the family dynamic that frustrates her. I also think it would be helpful to raise awareness to help Shannon [name] see some of the positive qualities of family and why she loves her family.  Suicidal/Homicidal: No  Plan: 1.Therapist work with Shannon FredericElla to better insight to family dynamic to help her interact with family and cope more effectively.2.Shannon [name] utilize discussion in group to work through emotions related to dynamics in family.  [facility] BH PHP THERAPIST GROUP NOTE  Date:07/10/14  Time: 1:00 PM-2:00 PM   Group  Topic/Focus:  The Process of Recovery  Participation Level:  Active  Participation Quality:  Sharing  Affect:  Depressed, Irritable, Tearful and at times brigher/labile  Cognitive:  Appropriate  Insight: Limited  Engagement in Group:  Engaged and Resistant  Modes of Intervention:  Discussion, Education and Support  Additional Comments: Group topic was on the recovery process.The discussion focused on how when a person in their addiction they are not able to see how crazy their life is getting even though other people are able to see it. When they stop using, open people see things getting better, but for the person in recovery the blinders come off and they became more aware of ways their life is falling apart. This is when they look around at some of the issues in their life and can have intense emotional reactions related to some of their problems and stressors whereas before they where were able to numb these emotions through using. The support of therapy and treatment gives them the support they may need to get through this time when they are vulnerable until they have more stability and a foundation. My perspective is that this helps Shannon [name] get more insight to her experience which will help her to cope better. Therapist will continue to provide support and insight oriented strategies to help in management of emotions.  Suicidal/Homicidal: no  Plan:1.[place] continue to work on strategies to manage emotions and process them in group.2.Therapist offer supportive interventions and education on effective coping strategies.     Diagnosis: Primary Diagnosis: Major depressive disorder, single episode, severe  without psychotic features [F32.2]    1. Major depressive disorder, single episode, severe without psychotic features   2. Polysubstance (excluding opioids) dependence       [name],[name] 07/10/2014

## 2014-07-11 ENCOUNTER — Ambulatory Visit (HOSPITAL_COMMUNITY): Payer: Self-pay | Admitting: Licensed Clinical Social Worker

## 2014-07-11 ENCOUNTER — Other Ambulatory Visit (HOSPITAL_COMMUNITY): Payer: BLUE CROSS/BLUE SHIELD | Admitting: Licensed Clinical Social Worker

## 2014-07-11 VITALS — Ht 66.0 in | Wt 132.2 lb

## 2014-07-11 DIAGNOSIS — F1994 Other psychoactive substance use, unspecified with psychoactive substance-induced mood disorder: Secondary | ICD-10-CM

## 2014-07-11 DIAGNOSIS — F329 Major depressive disorder, single episode, unspecified: Secondary | ICD-10-CM | POA: Diagnosis not present

## 2014-07-11 DIAGNOSIS — F1920 Other psychoactive substance dependence, uncomplicated: Secondary | ICD-10-CM

## 2014-07-11 DIAGNOSIS — F322 Major depressive disorder, single episode, severe without psychotic features: Secondary | ICD-10-CM

## 2014-07-11 NOTE — Progress Notes (Signed)
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   Subjective-  States she is having increased ADHD symptoms. States she is distractible and has difficulty concentrating and staying on task.   Objective : I have discussed case with therapists and have met with patient. Patient remains sober and seems highly motivated in recovery at this time. Has been going to NA meetings regularly through her residential sober housing setting and recently picked up her 30 day sobriety chip at [facility]. She has continued to ruminate about family stressors, and as noted with therapists tends to focus on perception that parents are uncaring or hypercritical automatically, in spite of parents' expressed support and lover for her during recent family meeting. She reports some ongoing anxiety and depression- denies any SI. Denies cravings for drugs at this time, which had been significant during the days following last drug use . She is tolerating Effexor XR well and denies side effects. She is now taking 150 mgrs QDAY .  As noted, she reports significant ADHD symptoms and states she has been on Stimulant medications in the past for management of this .  We discussed options and rationale to avoid addictive, stimulant medications and she expressed understanding. We discussed option of Atomoxetine, and reviewed side effect profile and rationale for use. Patient agreed to trial. .  Current Medications   Trazodone 50 mgrs QHS Effexor XR  150 mgrs QDAY   MSE - alert, attentive, good eye contact, mood has improved partially, but remains labile and somewhat anxious in affect. She does smile often and appropriately , and range of affect is improved . No thought disorder. No SI, no HI, no hallucinations, no delusions   Assessment- Patient  partially improved compared to admission. Now sober x 30 days , and very motivated in sobriety at this time. As she remains abstinent, sober, she is confronting more negative and anxious emotional states that were previously blunted  By drug use, and she continues to ruminate about her relationship with her parents, although has started to be able to communicate how she feels to them better. Thus far tolerating Effexor XR , now at 150 mgrs QDAY, well. Reports long history of ADHD symptoms , recently worsening. Agrees to Atomoxetine trial.  Plan- continue PHP. Continue Effexor XR 150 mgrs QAM for depression and anxiety- does not need scripts at present Start Atomoxetine 25 mgrs QDAY for ADHD symptoms, side effects discussed. Have given her paper script for 10 days , one refill. Continue to encourage sobriety and relapse prevention/ 12 step meeting attendance efforts.  [name], MD

## 2014-07-11 NOTE — Psych (Signed)
D. Patient presented with appropriate affect, level mood and denied any current problems with symptoms or side effects to medications.  No suicidal or homicidal ideations and discussed how patient felt her family session went this week.  [name], [facility], from patient's perspective the meeting "didn't go so well" as patient reported her parents responded the way she thought they would respond, "that they are doing everything right" in the way they are treating her and not receptive to feedback.  Discussed with patient and therapist patients status with possibly looking at the session as something that needed to be addressed for her to now move forward with upcoming decisions about what she wants to do with work and other plans for her life after PHP.  A. Patient rated her depression a 3, her hopelessness a 2 and her anxiety a 4 on a scale of 0 being none at all and 10 being the worst she could imagine.  Patient stated she was planning to get a job after completing PHP, stated Dr. Jama Flavorsobos started her on Atomoxetine (Strattera) 25mg  one by mouth each morning and would see her back in the coming week to assess how it was working.  Patient now taking Trazodone 100mg  one at bedtime and Effexor XR 150mg  each day.  Patient stated sleeping better and no problems with appetite. Patient's biggest complaint was stating she was tired of therapy as had figured it up and [facility] over a 100 hours now.  Patient stated she had learned coping skills and was using them.  O. Patient stable today with no complaints about her medications, hopes Strattera will help with focus and is looking forward to completing PHP.  Patient states it has been effective as she is remaining sober, and admits to physically doing better as well.  [facility] and continues to attend SA groups for support at [facility]. Patient requested not to  complete PHQ9 today as reminded this nurse she hates "a lot of questions".  Patient was cooperative; however, during our conversation and much noted decreased anxiety and agitation noted in patient's posture and mannerisms.

## 2014-07-11 NOTE — Psych (Unsigned)
   [facility] PHP THERAPIST PROGRESS NOTE  Sharion Settlerlla Linehan [id number]    Date:  07/11/2014 Time:  3:53 PM  Group Topic/Focus: Crisis Planning:   The purpose of this group is to help patients create a crisis plan for use upon discharge or in the future, as needed. Diagnosis Education:   The focus of this group is to discuss the major disorders that patients maybe diagnosed with.  Group discusses the importance of knowing what one's diagnosis is so that one can understand treatment and better advocate for oneself. Early Warning Signs:   The focus of this group is to help patients identify signs or symptoms they exhibit before slipping into an unhealthy state or crisis. Relapse Prevention Planning:   The focus of this group is to define relapse and discuss the need for planning to combat relapse.  Participation Level:  Active  Participation Quality:  Sharing  Affect:  Apathetic  Cognitive:  Appropriate  Insight: Improving  Engagement in Group:  Developing/Improving  Modes of Intervention:  Clarification, Discussion, Limit-setting, Problem-solving, Reality Testing and Support  Additional Comments:  [name] states "I'm just tired of being here. I'm running out of things to talk about."   Suicidal/Homicidal: Negativewithout intent/plan  Therapist Response: Therapist discussed with her the process of treatment and that running out of things to discuss is in fact a sign of improvement. Therapist inquired about her plans upon discharge regarding school, [facility] and traveling home to visit her parents. [name] still contends that being with her parents is an extreme stressor for her and she does not intend to visit anytime soon. She reports having "definitelty no definite plan at this time". [facility] until she feels she is ready to move out and live on her own without the lure of substances. She is unsure about returning to school to finish her degree but is  adamant that she will not be returning to [employer] to do so. Therapist asked about current relationships. Therapist asked her to clarify facial expressions which could indicate the possibility of a present relationship. She clarified that there is "no one in particular" but indicated that someone [facility] is trying to "set her up" with another [facility] resident. Therapist cautioned her about relationships during recovery and explored the pros and cons as well as discussed the probability of the stress of a relationship leading to relapse for them both.   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 Major depressive disorder, single episode, severe without psychotic features   2. Polysubstance (excluding opioids) dependence   3. Substance induced mood disorder       Ammiel Guiney, LCSW 07/11/2014

## 2014-07-11 NOTE — Addendum Note (Signed)
Addended by: Nehemiah MassedOBOS, Norrin Shreffler A on: 07/11/2014 04:54 PM   Modules accepted: Orders, Medications

## 2014-07-11 NOTE — Psych (Signed)
[facility] PHP THERAPIST PROGRESS NOTE  [name] [id number]  [facility]  Date: 07/11/14   Time: 11:00 AM -12:30 PM  Group Topic/Focus:  Resilience  Participation Level:  Active  Participation Quality:  Appropriate, Attentive and Sharing  Affect:  Euthymic  Cognitive:  Alert and Appropriate  Insight: Improving  Engagement in Group:  Engaged  Modes of Intervention:  Discussion, Education, Exploration and Support  Additional Comments:  Group topic for today was discussion on resilience. Group began with a check in. Shannon [name] talked about her weekend and said that she has no plans but that "it was better than tripping on acid". This statement was positive change-talk and therapist provided positive feedback to reinforce this positive statement. Members learned about elements of resilience including traits of hardiness which means is described as a commitment to finding a meaningful purpose in life, or a belief that one can influence one's surroundings and the outcomes of events, and the belief that one can learn from positive and negative experiences. Self-enhancement includes the development of overly positive biases in favor of the self. Shannon [name] related that she actually has good self-esteem except in relationship to her parents. Repressive coping involves avoidance of unpleasant thoughts, emotions, and memories. Positive emotion involves the use of responses such as laughter, when faced with an adverse event. Therapist pointed out that humor is one of her strengths and we highlighted other strengths such as her ability to adapt be flexibility. Shannon [name] also related that she feels she is pretty resilient and therapist reinforced by agreeing with this self-concept. She talked about family meeting and doesn't think it should have happened because it was pointless and set her back. She does not want to do that again because she feels it was pointless. Therapist related that at least  she can say that she tried it and that if she feels that the dynamic of parents won't change then she can change how she relates to this dynamic so it won't crush her. Shannon [name] worked on issues today and participated actively and appropriately.  Suicidal/Homicidal: no  Plan:1.Therapist continue to emphasize Shannon [name]'s strengths and positive change-talk.2.Shannon [name] continue to work on strategies to help her build a [employer]ation and move forward in the recovery process.   [facility] BH PHP THERAPIST GROUP NOTE  Date: 07/11/14   Time: 1:00 PM-2:00 PM  Group Topic/Focus:  Worksheet on "Cue Controlled Relaxation" and "Let the Feelings Flow"  Participation Level:  Active  Participation Quality:  Appropriate and Sharing  Affect:  Euthymic  Cognitive:  Alert and Appropriate  Insight: Improving  Engagement in Group:  Engaged  Modes of Intervention:  Activity, Discussion, Education and Exploration  Additional Comments: Group topic for today involved worksheet on "Cue Controlled Relaxation" and "Let the Feelings Flow". Therapist explained that cue controlled relaxation is helpful because it is a technique that you can practice during the day, regardless of where you are. You can use it to relax yourself when you are the most tense. Therapist pointed out that this might help Shannon [name] when she finds herself lashing out or snapping quickly. Shannon [name] explained that this is not an issue for her except with her parents. She also said that relaxation activities do not work for her and have the opposite effect because her mind causes her to be more stressed. She needs to be more active. We reviewed her stress management techniques that she feels are helpful for her that include a form of grounding, music, cigarettes, and  friends. Her input on the feelings worksheet was to agree that processing her feelings and not covering up and ignoring her feelings is something that helps her. Shannon [name] actively participated through actively sharing.    Suicidal/Homicidal: no  Plan: 1.Shannon [name] apply effective coping strategies such as processing feelings to manage emotions.2.Therapist continue to teach Shannon [name] effective coping strategies to build a foundation for her recovery.     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 Major depressive disorder, single episode, severe without psychotic features   2. Polysubstance (excluding opioids) dependence       [name],[name] 07/11/2014

## 2014-07-14 ENCOUNTER — Encounter (HOSPITAL_COMMUNITY): Payer: Self-pay | Admitting: Licensed Clinical Social Worker

## 2014-07-14 ENCOUNTER — Other Ambulatory Visit (HOSPITAL_COMMUNITY): Payer: BLUE CROSS/BLUE SHIELD | Admitting: Licensed Clinical Social Worker

## 2014-07-14 DIAGNOSIS — F1920 Other psychoactive substance dependence, uncomplicated: Secondary | ICD-10-CM

## 2014-07-14 DIAGNOSIS — F329 Major depressive disorder, single episode, unspecified: Secondary | ICD-10-CM | POA: Diagnosis not present

## 2014-07-14 DIAGNOSIS — F322 Major depressive disorder, single episode, severe without psychotic features: Secondary | ICD-10-CM

## 2014-07-14 NOTE — Psych (Addendum)
[facility]  Shannon [name] [name] [id number]   Date: 07/14/14 Time: 11:00 AM-12:30 PM   Group Topic/Focus:  Recovery and Healing Family Relationships  Participation Level:  Active  Participation Quality:  Appropriate and Sharing  Affect:  Appropriate  Cognitive:  Alert and Appropriate  Insight: Improving  Engagement in Group:  Engaged  Modes of Intervention:  Discussion and Problem-solving  Additional Comments: Group topic for today was recovery and healing family relationships. Group started with a check in. Shannon [name] related that she went home to surprise her parents and it was a positive experience. Therapist pointed out that intervention to help improve relationship had seemed to have helped despite patient's report last week that she was disappointment with family session and that it had not helped. Patient related that she did fun activities with the family and therapist reinforced that spending time with each other and not getting into arguments had been encouraged at the meeting and seems to have worked. Shannon [name] agreed. Therapist pointed out that in family relationships, the patient has changed in recovery and that it takes time for families to readjust and get their bearings to the change. Shannon [name] has taken a big step forward in willing to spend time with family and start to work toward mending wounds in the family.  Suicidal/Homicidal: no  Plan:1.Therapist continue to encourage healthy strategies for patient in relating to her family in order to improve family dynamics.2.Patient continue to work on mending relationship with her family that will be empowering for herself.     [facility] PHP THERAPIST GROUP NOTE  Date: 07/14/14 Time: 1:00 PM-2:30 PM  Group Topic/Focus:  Wellness, Recovery and Aftercare Planning  Participation Level:  Active  Participation Quality:  Appropriate and Attentive  Affect:  Appropriate  Cognitive:  Alert and Appropriate  Insight:  Improving  Engagement in Group:  Engaged  Modes of Intervention:  Discussion, Exploration and Problem-solving  Additional Comments: Group topic was on wellness, recovery and aftercare planning. Therapist talked to patient about [facility] that will support her recovery. Shannon [name] affirmed that mood and anxiety improved and can see the relationship with abstinence and helping coping strategies to improve symptoms. This provides motivation for her to continue in a positive direction with her recovery. She needs to look for work and [facility]. She could use help in looking for work and is agreeable to a referral to a job Doctor, general practicesearch agency. She wants to continue with her psychiatrist and agreeable for a referral to a therapist. She enjoys her meetings but says that the spiritual element of the problem still turns her off and is still working that out. Therapist encouraged her to see that the program has many benefits and has been helpful in order to encourage her to continue with this. We discussed her new medication Strattera and she feels it makes her sleepy. She prefers Adderall that helps her to feel more energized as well as focused. She realizes the negative aspects and why the doctor does not want to prescribe the medication. Therapist talked to NehalemElla about staying on the medication long enough to see if it will work. Shannon [name] has made good progress including improvement of anxiety and depressive symptoms and having some support in place to help her in continuing her recovery [facility].   Suicidal/Homicidal: no  Plan: 1.Shannon [name] continue to take positive steps to make progress in her recovery program.2.Therapist provide supportive as well as motivational strategies  to provide interventions to continue progress.   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 Major depressive disorder, single  episode, severe without psychotic features   2. Polysubstance (excluding opioids) dependence       Shannon [name] A 07/14/2014

## 2014-07-15 ENCOUNTER — Other Ambulatory Visit (HOSPITAL_COMMUNITY): Payer: BLUE CROSS/BLUE SHIELD | Admitting: Licensed Clinical Social Worker

## 2014-07-15 DIAGNOSIS — F322 Major depressive disorder, single episode, severe without psychotic features: Secondary | ICD-10-CM

## 2014-07-15 DIAGNOSIS — F329 Major depressive disorder, single episode, unspecified: Secondary | ICD-10-CM | POA: Diagnosis not present

## 2014-07-15 DIAGNOSIS — F1920 Other psychoactive substance dependence, uncomplicated: Secondary | ICD-10-CM

## 2014-07-15 NOTE — Psych (Addendum)
   [facility]  Shannon [name] [id number]    Date: 07/15/14 Time: 11:00 AM-12:30 PM   Group Topic/Focus:  Group topic was on healthy activities to improve mood including stretching, deep breathing exercises, guided imagery and progressive relaxation  Participation Level:  Active  Participation Quality:  Appropriate and Attentive  Affect:  Appropriate  Cognitive:  Alert and Appropriate  Insight: Appropriate  Engagement in Group:  Engaged  Modes of Intervention:  Activity and Education  Additional Comments: Today's group was on healthy activities to improve mood. Patients were led through stretches, body movements, deep breathing exercises, guided imagery and deep relaxation exercies. Therapist explained that these are exercises to improve mood and anxiety and are healthy ways to improve mood that won't cause more harm to their body tthat drug use does. Patient enjoyed the activity and related that the activity showed her that she can have fun in recovery.  Suicidal/Homicidal: no  Plan:1.Shital practice positive ways to improve mood and anxiety such as deep breathing, stretching, and relaxation exercises.2.Therapist continues to work with patient and educate her on healthy strategies to improve mood and to have fun in her recovery.  [facility] PHP THERAPIST GROUP NOTE  Date: 07/15/14  Time: 1:00 PM-2:30 PM   Group Topic/Focus:  Developing a Recovery Program Including a Relapse Prevention Strategy  Participation Level:  Active  Participation Quality:  Appropriate and Attentive  Affect:  Appropriate  Cognitive:  Alert and Appropriate  Insight: Improving  Engagement in Group:  Engaged  Modes of Intervention:  Discussion, Education, Exploration, Problem-solving and Support  Additional Comments: Group topic for today was on developing a recovery program including a relapse prevention strategy. Therapist was joined by a peer support specialist in group to  offer input as well. Shannon [name] identified that she was triggered through visual and smell of marijuana. Shannon [name] was reinforced in her identification of reasons to stay clean by therapist. She was reinforced by therapist as well in the plan she has put in place to support her recovery. This includes living at a recovery house, going to NA, and surrounding herself with people who can identify with the way she feels. She affirms that she is going through adjustment that is not only physical but also mental and emotion to life without using. She recognizes that she is looking for healthy ways to replace usage and finding ways that get her to the same point or better that are healthy and not illegal. Shannon [name] has made good progress in putting in place a recovery plan that will support her in her recovery. Effective interventions at this point help to reinforce the progress she has made.   Suicidal/Homicidal: no  Plan: 1.Therapist provide positive reinforcement of healthy attitudes and healthy behaviors that Shannon [name] has to help her to make progress in the recovery plan she has implemented.2.Therapist continue to offer supportive interventions as well as interventions that offer insight to strengthening her recovery program and managing triggers.   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 Major depressive disorder, single episode, severe without psychotic features   2. Polysubstance (excluding opioids) dependence       Shannon [name] A 07/15/2014

## 2014-07-16 ENCOUNTER — Other Ambulatory Visit (HOSPITAL_COMMUNITY): Payer: BLUE CROSS/BLUE SHIELD | Admitting: Licensed Clinical Social Worker

## 2014-07-16 ENCOUNTER — Ambulatory Visit (HOSPITAL_COMMUNITY): Payer: Self-pay | Admitting: Licensed Clinical Social Worker

## 2014-07-16 DIAGNOSIS — F1920 Other psychoactive substance dependence, uncomplicated: Secondary | ICD-10-CM

## 2014-07-16 DIAGNOSIS — F322 Major depressive disorder, single episode, severe without psychotic features: Secondary | ICD-10-CM

## 2014-07-16 DIAGNOSIS — F1994 Other psychoactive substance use, unspecified with psychoactive substance-induced mood disorder: Secondary | ICD-10-CM

## 2014-07-16 NOTE — Psych (Signed)
   [facility]  Sharion Settlerlla Platt [id number]  Date:  07/16/2014 Time:  4:22 PM  Group Topic/Focus:  Coping With Mental Health Crisis:   The purpose of this group is to help patients identify strategies for coping with mental health crisis.  Group discusses possible causes of crisis and ways to manage them effectively. Overcoming Stress:   The focus of this group is to define stress and help patients assess their triggers. Rediscovering Joy:   The focus of this group is to explore various ways to relieve stress in a positive manner. Self Care:   The focus of this group is to help patients understand the importance of self-care in order to improve or restore emotional, physical, spiritual, interpersonal, and financial health.  Participation Level:  Active  Participation Quality:  Appropriate  Affect:  Appropriate  Cognitive:  Alert  Insight: Appropriate  Engagement in Group:  Engaged  Modes of Intervention:  Activity, Discussion, Education, Exploration, Problem-solving and Support  Additional Comments:  Patients were taught about defining the problem clearly, brainstorming multiple solutions, listing the pros and cons of each solution, seeking input from others, selecting and implementing a plan of action, and evaluating and readjusting the outcome. Patients displayed a clear understanding of the use of the problem-solving skills, and were asked to provide examples. Patients were asked about how they are implementing calming and grounding techniques in their daily life and when these techniques are used. Patients were asked to discuss a time or times when excessive worry was beyond the scope of rationality and they felt unable to control it. Patients were assisted in exploring worries concerning issues related to family, personal safety, health, and employment, among other things. The client was taught how to recognize, stop, and postpone worry until the agreed upon worry  time. Patients were given an activity and were shown how to respond to issues by utilizing techniques such as: thought stopping, relaxation, and redirection of attention. Each patient was provided with feedback on how they were planning to use the techniques in their daily lives.   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 Major depressive disorder, single episode, severe without psychotic features   2. Polysubstance (excluding opioids) dependence   3. Substance induced mood disorder       Shannon Sokolowski, LCSW 07/16/2014

## 2014-07-16 NOTE — Progress Notes (Signed)
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   Subjective- Patient reports overall improvement. She recently went to visit her mother in [place], [place] for Mother's Day. This was very significant, as their relationship has been difficult and patient had been angry with mother. The visit went well and patient states they were both better able to communicate with less negativity and criticisms. In this context, patient is feeling better , and also has an increased sense of self esteem related to her being able to " extend an olive branch " to her mother, with good results. On last session she had complained about poor concentration and history of ADHD.  States that thus far Strattera not causing any side effects , but concerned about cost.   Objective : I have discussed case with therapists and have met with patient. Patient is improved compared to admission and continues to improve gradually. As noted, she had a recent visit to her parents and visit went well, which is contributing to her feeling better about her relationships and herself. She remains sober, and is still living in sober housing . She has been concerned about ADHD, and has stated she wishes she could be prescribed stimulant medication, but understands rationale to avoid any potential addictive medication at this time. She has been going to NA daily, does not have a sponsor and I have encouraged her to consider getting at least a temporary sponsor for now. She enjoys meetings and seems motivated in recovery. In the context of improved mood and ongoing sobriety her sleep has improved, reports improved relationship with her roommate at [employer] and is wanting to look for a job soon.  She is tolerating Effexor XR and low dose Strattera  well and  denies side effects. She is now taking 150 mgrs QDAY  And 25 mgrs QDAY, respectively  We have reviewed medication side effects . Marland Kitchen  Current Medications   Trazodone 50 mgrs QHS Effexor XR 150 mgrs QDAY  Atomoxetine 25 mgrs QDAY   MSE - alert, attentive, good eye contact, improved grooming,  mood is euthymic , affect fuller in range although still vaguely anxious  and somewhat anxious in affect.  . No thought disorder. No SI, no HI, no hallucinations, no delusions . Future oriented, and currently focused on getting a job soon  Assessment- Patient continues to improve, remains sober, and  A recent visit to her mother went well and she described decreased family related tension.  She is tolerating Effexor XR and Atomoxetine well, but is concerned that the latter is not working at current dose , and also has concerns about cost .  Is wanting to discharge from [facility] soon .  Plan- As discussed with therapists and patient, discharge from [facility] tomorrow Thursday or Friday.  Therapist working on establishing follow up plans /referrals. Encouraged patient to continue attending NA, getting a sponsor. At patient's request, RENEW  EFFEXOR XR 150 mgrs QDAY - # 21 , one refill INCREASE STRATTERA to 40 mgrs QDAY- #21, one refill. Paper script given as this computer not connected to e-script .   [name], MD

## 2014-07-16 NOTE — Psych (Signed)
   [facility]  Sharion Settlerlla Bame [id number]   Date: 07/16/2014 Time: 11:00 AM-12:30 PM   Group Topic/Focus:  Group topic is Stress Management  Participation Level:  Active  Participation Quality:  Appropriate and Sharing  Affect:  Appropriate  Cognitive:  Alert and Appropriate  Insight: Improving  Engagement in Group:  Engaged  Modes of Intervention:  Discussion, Education, Problem-solving, Support and Coping  Additional Comments: The main focus of today's group was stress management. Group started with a check in. The group discussed where their stress comes from including the unknown, self-defeating thoughts, and predicting the worst case scenario. The group including [name] could identify specific stressors such as school. The group identified coping strategies to manage stress including exposure, creating a check list and that daily maintenance strategies help maintain one's well-being that are helpful so one is more able to manage stress when it occurs. We discussed how doing something creative is a good outlet for releasing energy and described it as like releasing an Teaching laboratory technicianelectric charge. [name] related that she went to the woods and took pictures and therapist input was that this was a positive sign of her progress to engage in an activity that she enjoys.   Suicidal/Homicidal: no  Plan:1.Meggin apply stress management strategies to stressors in daily life.2.Therapist provide positive reinforcement for positive strategies and continue to educate Marabella on healthy coping strategies.   [facility] PHP THERAPIST GROUP NOTE  Date: 07/16/14   Time:1:00 PM-2:00 PM   Group Topic/Focus:  Strategies to Improve Mental Health Symptoms  Participation Level:  Active  Participation Quality:  Appropriate and Sharing  Affect:  Appropriate  Cognitive:  Appropriate  Insight: Improving  Engagement in Group:  Engaged  Modes of Intervention:  Discussion and  Education  Additional Comments:Therapist was assisted today in group therapy by peer specialist and discussion related to strategies that have helped in improving mental health symptoms. Peer specialist discussed how when someone has mental health symptoms one's area of comfort narrows and one does not feel comfortable doing what seems normal to other people. We discussed how it has helped to expand one's comfort zone and was described as the "dignity of risk". It means to undertake a challenging task to expand your comfort zone that does not make symptoms worse. This helps to expand your comfort zone and in expanding your comfort zone you are helping to improve your symptoms. [name] participated in discussion actively and appropriately. This was useful for her in learning strategies to help with symptoms.   Suicidal/Homicidal: no  Plan:1.Shallyn apply strategies she is learning to improve mental health symptoms.2.Therapist continue to educate [name] on effective strategies to improve symptoms.    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 Major depressive disorder, single episode, severe without psychotic features   2. Polysubstance (excluding opioids) dependence       Briahnna Harries A 07/16/2014

## 2014-07-17 ENCOUNTER — Other Ambulatory Visit (HOSPITAL_COMMUNITY): Payer: BLUE CROSS/BLUE SHIELD | Admitting: Licensed Clinical Social Worker

## 2014-07-17 DIAGNOSIS — F329 Major depressive disorder, single episode, unspecified: Secondary | ICD-10-CM | POA: Diagnosis not present

## 2014-07-17 DIAGNOSIS — F1920 Other psychoactive substance dependence, uncomplicated: Secondary | ICD-10-CM

## 2014-07-17 DIAGNOSIS — F322 Major depressive disorder, single episode, severe without psychotic features: Secondary | ICD-10-CM

## 2014-07-17 MED ORDER — ATOMOXETINE HCL 40 MG PO CAPS: 40.0000 mg | ORAL_CAPSULE | Freq: Every day | ORAL | Status: AC

## 2014-07-17 MED ORDER — VENLAFAXINE HCL ER 150 MG PO CP24: 150.0000 mg | ORAL_CAPSULE | Freq: Every day | ORAL | Status: AC

## 2014-07-17 NOTE — Psych (Signed)
   [facility] THERAPIST [facility]GR[facility]  Sharion Settlerlla [place]oks [id number]    Date: 07/17/14 Time: 11:00 AM-12:30 PM  Group Topic/Focus:  Building Healthy Coping Strategies  Participation Level:  Active  Participation Quality:  Appropriate  Affect:  Appropriate and Irritable  Cognitive:  Alert  Insight: Improving  Engagement in Group:  Engaged  Modes of Intervention:  Discussion and Support  Additional Comments: Group topic for today was on healthy coping strategies. Group began with check in. Patient discussed some of the progress she has made in treatment. She is stronger in her commitment to staying clean, sees the negatives of using and is better with her mourning process of giving up drugs. [name] still displays emotional reactivity that are part of the process of early recovery as she learns to deal with her emotions without using. It will be helpful that she has her supports in place both at her recovery house and [facility] to help her process through issues in healthy ways without acting out or using negative coping strategies.   [facility] PHP THERAPIST GROUP NOTE  Date: 07/17/14 Time: 1:00 PM-2:00 PM   Group Topic/Focus:  Building a Wellness Plan  Participation Level:  Active  Participation Quality:  Appropriate, Attentive and Sharing  Affect:  Appropriate and Irritable  Cognitive:  Alert and Appropriate  Insight: Improving  Engagement in Group:  Engaged  Modes of Intervention:  Activity, Discussion, Education, Problem-solving and Prinicples of Building a Recovery Program  Additional Comments: The focus of the group was on building a wellness plan. Therapist conveyed the message through videos with different messages and discussion of videos. The group agreed that humor was an effective way to help with depression and anxiety. Elements of helping her with her recovery program were discussed. The discussion of group also underscored some recovery  concepts. It was pointed out that the reasoning behind not getting into a relationship is that you jump into a relationship and you are not dealing with your feelings just as in your addiction. She was encouraged to get a sponsor and that the steps will help and are good medicine. [name] is struggling with frustration of the cost of Strattera seems prohibitive to her. She was advised not to return to Adderall because of her addictive history she will end of abusing it. This was a good group session to reinforce recovery principles for [facility].   Suicidal/Homicidal: no  Plan: 1.[name] continue to implement a daily recovery program and use supports to work through stressors.2.[name] gain insight of recovery principles that will support her with her recovery.    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 Major depressive disorder, single episode, severe without psychotic features   2. Polysubstance (excluding opioids) dependence       Aurianna Earlywine A 07/17/2014

## 2014-07-17 NOTE — Psych (Signed)
[facility] Psych Discharge [facility]  Shannon [name] [id number]  Admission date: 06/17/14 Discharge date: 07/17/14  Reason for admission: Patient is [name] 24 year old female who was [facility], and was discharged from the unit on 06/13/14. She had originally presented to ED with severe sore throat, but had, during assessment, also endorsed depression and suicidal thoughts, resulting in psychiatric admission. She had [name] history of substance dependence, and had been using different drugs regularly, but identified hallucinogens as substance of choice over recent months. Upon discharge from hospital last Friday she has [place] in [place] [facility] [place]. She stated she is doing well there and reported she had not relapsed, although had some cravings.  She had been going to [employer] meetings daily, as this was part of [facility] [facility] activities. She presented with [name] partially improved mood, although did not feel it iwas back to normal yet. She continued to report some neuro-vegetative symptoms of depression such as vague anhedonia, fair appetite, low self esteem (although better). She denied any ongoing suicidal ideations. She stated that she had been diagnosed with ADHD in the past and history of depression. 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 She stated that in that in college hallucinogens such as LSD and mushrooms became substance of choice.  There is also [name] history of BZD/Xanax ABuse. She stated that there is [name] prior history of drinking alcohol in binges but not recently and did not describe alcohol abuse, denied opiate abuse  Progress in Program Toward Treatment Goals: When Shannon [name] first started treatment, she presented with anxiety, cravings and irritability, all signs of post acute withdrawal. She put in place [name] recovery plan, however, that would support her recovery. She had excellent attendance and  good participation that included being [name] participant who wanted the group to be [name] place to talk honestly about their issues. She reported improvement in depression and could connect this to discontinuing substance abuse. She was triggered occasionally, had cravings even toward the end of treatment, had some emotional lability and irritability but was much better than when she first started and she was willing to work through triggers and cravings by talking about it and applying relapse prevention strategies. She acknowledged going through [name] mourning process with the loss of drugs including the loss of the culture and lifestyle that went with this but she was doing better with this process and finding alternatives to using and her old lifestyle that were helping her. She was also applying healthier coping strategies including her art work, her supports and her music. In group she worked on CBT, CBT and coping strategies in general which helped contribute to learning more effective ways to manage stressors and emotions. In treatment, she also began to address the problems in her family relationships, in particular the anger she has had toward her parents. She had [name] family meeting by phone and she had [name] visit to her parents and visit went well, which contributed to her feeling better about her relationships and herself as she was the one to initiate the visit. She was better able to communicate with them and less negativity and criticism. Throughout treatment she remained sober and was committed to making [name] change in her lifestyle and was still living in sober housing. She was concerned about ADHD, and stated that she wished she could be prescribed stimulant medication, but understood the rationale to avoid any potential  addictive medication.  She had been going to NA daily, did not have [name] sponsor and she was encouraged to consider getting at least [name] temporary sponsor for now. She enjoyed meetings and seems motivated  in recovery.  Progress (rationale): Shannon [name] reports overall improvement. PatienSamson [name] was improved compared to admission and continued to improve gradually. As noted, she had [name] recent visit to her parents and visit went well, which contributed to her feeling better about her relationships and herself. She remained sober, and was still living in sober housing. Shannon [name] had started to build [name] recovery program that would support her in her recovery. She was going to [employer] meetings daily, lived in [name] sober house and seemed motivated in her recovery. She wanted to get [name] job soon. She presented with positive attitude of "wanting it to be different", initiated work on attitudinal and behavior changes, and motivated for her recovery. She was doing better at trigger recognition and implementation or relapse prevention strategies when triggered. Family therapy is suggested to help work on better communication and to continue the work on changing unhealthy family dynamics. She needed to continue to make progress by finding [name] sponsor, going to NA, surrounding herself with recovery supports, applying effective coping strategies and working [name] daily recovery program.   Discharge Plan: 1.Referral to Psychiatrist-Shannon [name][facility]-07/23/14 at 2:00 PM 2.Referral to Counselor/Psychotherapist-Shannon [name] 08/25/14 at 10:00 AM 3.Medications-Effexor XR 150 mgrs QDAY, Strattera 40 mgrs QDAY.  Dx- Major Depression without psychotic symptoms versus Substance Induced Mood Disorder, Depressed   Polysubstance Dependence, - hallucinogens, BZDs, Cannabis   Shannon [name] 07/17/2014

## 2014-07-18 ENCOUNTER — Ambulatory Visit (HOSPITAL_COMMUNITY): Payer: Self-pay | Admitting: Licensed Clinical Social Worker

## 2014-07-18 ENCOUNTER — Other Ambulatory Visit (HOSPITAL_COMMUNITY): Payer: BLUE CROSS/BLUE SHIELD | Admitting: Licensed Clinical Social Worker

## 2014-07-18 DIAGNOSIS — F329 Major depressive disorder, single episode, unspecified: Secondary | ICD-10-CM | POA: Diagnosis not present

## 2014-07-18 DIAGNOSIS — F1920 Other psychoactive substance dependence, uncomplicated: Secondary | ICD-10-CM

## 2014-07-18 DIAGNOSIS — F322 Major depressive disorder, single episode, severe without psychotic features: Secondary | ICD-10-CM

## 2014-07-18 DIAGNOSIS — F1994 Other psychoactive substance use, unspecified with psychoactive substance-induced mood disorder: Secondary | ICD-10-CM

## 2014-07-18 NOTE — Psych (Signed)
   [facility] BH PHP THERAPIST PROGRESS NOTE  Shannon [name] [id number]  Date:  07/17/14 Time:  4:35 PM  Group Topic/Focus: Emotional Education:   The focus of this group is to discuss what feelings/emotions are, and how they are experienced. Identifying Needs:   The focus of this group is to help patients identify their personal needs that have been historically problematic and identify healthy behaviors to address their needs. Making Healthy Choices:   The focus of this group is to help patients identify negative/unhealthy choices they were using prior to admission and identify positive/healthier coping strategies to replace them upon discharge. Managing Feelings:   The focus of this group is to identify what feelings patients have difficulty handling and develop a plan to handle them in a healthier way upon discharge.  Participation Level:  Active  Participation Quality:  Appropriate  Affect:  Appropriate  Cognitive:  Appropriate  Insight: Good  Engagement in Group:  Engaged  Modes of Intervention:  Activity, Discussion, Exploration and Reality Testing  Additional Comments:  Clips shown and processed from the movie "Inside Out" were shown and emotional responses to unrealistic distortions were examined by identifying distorted patterns in thinking in comparison to emotions being experienced in the presenting moment. The group collaboratively assisted in identifying steps that could be taken to begin resolving issues that contribute to their issues with persistent fear and worry. As the group gained understanding of their role in the cycle of anxiety, emotional responses and their ability to challenge real vs. false beliefs, it was expressed that it has helped to explain, identify and projected to resolve life conflicts. Each group member appeared to display insight into unresolved conflicts and how they contribute to his/her persistent fears. Techniques, strategies, coping mechanisms and  interventions were discussed and developed with the group as a whole. Unhealthy responses were processed and alternative interventions were gently offered in this area.  Suicidal/Homicidal: Negativewithout intent/plan  Plan:  (1) Pt will be discharged from [facility] on 07/18/14 (2) Pt reviewed and discussed aftercare plan (3) Pt met with both PHP therapists to review discharge planning (4) Pt will continue to take all medications as prescribed (5) Pt will complete all aftercare referrals and recommendations (6) Pt was informed of the 30/60/90 day follow up upon discharge (7) Pt was administered DAST & PHQ-9 screenings (8) Pt discharge information will be scanned into chart (9) Pt met with nurse and PHP psych on yesterday to review medications and discharge procedures   Diagnosis: Primary Diagnosis: Major depressive disorder, single episode, 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 Major depressive disorder, single episode, severe without psychotic features   2. Polysubstance (excluding opioids) dependence   3. Substance induced mood disorder       Shannon Magnussen, LCSW 07/17/14

## 2014-07-18 NOTE — Psych (Signed)
   [facility]  Sharion Settlerlla Vences [id number]    Date:  07/18/2014 Time:  4:41 PM  Group Topic/Focus:  Conflict Resolution:   The focus of this group is to discuss the conflict resolution process and how it may be used upon discharge. Early Warning Signs:   The focus of this group is to help patients identify signs or symptoms they exhibit before slipping into an unhealthy state or crisis.Healthy Communication:   The focus of this group is to discuss communication, barriers to communication, as well as healthy ways to communicate with others.Managing Feelings:   The focus of this group is to identify what feelings patients have difficulty handling and develop a plan to handle them in a healthier way upon discharge.Primary and Secondary Emotions:   The focus of this group is to discuss the difference between primary and secondary emotions.Wrap-Up Group:   The focus of this group is to help patients review their daily goal of treatment and discuss progress on daily workbooks.  Participation Level:  Active  Participation Quality:  Appropriate  Affect:  Appropriate  Cognitive:  Appropriate  Insight: Good  Engagement in Group:  Engaged  Modes of Intervention:  Activity, Confrontation, Discussion, Education, Exploration, Limit-setting, Problem-solving and Role-play  Additional Comments:  Patients were asked to list any and all relationships that have been lost due to a pattern of antisocial behavior. A discussion was initiated to address patient's current past significant relationships, including themes related to grief, interpersonal role disputes, role transitions, and skill deficits. Patients were supported as they shared stories and concerns related to both past and present interpersonal relationships. As negative relationship patterns were reviewed, the client was confronted with their responsibility for the actions that resulted in the broken relationships. Patients were  asked to identify what behavior of their own led to the break-up of the relationship. Group members provided each other with support as they openly described past issues in which they have experienced a sense of loss and pain. Group members were firmly and consistently confronted with the reality of owning behavior that caused pain to others and resulted in their breaking off the relationship. Patients were asked to identify personal insensitivity to the needs and feelings of others. Role-reversal techniques were used to attempt to get them in touch with the pain caused in others due to lack of respect, disloyalty, vengeance, jealousy, aggression, or dishonesty.   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 Major depressive disorder, single episode, severe without psychotic features   2. Polysubstance (excluding opioids) dependence   3. Substance induced mood disorder       Maher Shon, LCSW 07/18/2014

## 2014-07-18 NOTE — Psych (Signed)
   [facility]  Shannon [name] [id number]   [facility] THERAPIST GROUP NOTE  Date: 07/18/14 Time: 11:00 AM-12:30 PM  Group Topic/Focus:  Self-Esteem  Participation Level:  Active  Participation Quality:  Appropriate  Affect:  Appropriate  Cognitive:  Alert  Insight: Improving  Engagement in Group:  Engaged  Modes of Intervention:  Activity, Discussion, Exploration and Problem-solving  Additional Comments: Group topic today was based off of [employer] [name]'s Ted Talk called "The person You Really Need [facility]". The video discussed that the person you really need to marry is yourself. That you need to commit to yourself fully, and make yourself whole. Self-acceptance and self-love is important to you accept yourself for better or worse, you become the person you can count on and learn to comfort yourself. This was [name] good video to help focus group on goals to work on in treatment. This is Shannon [name]'s last day so we talked about looking ahead and any concerns she sees once discharged. She thinks she is doing good and this treatment has given her [name] foundation. She related that she talked to roommates and came up with [name] solution for her medication. They will lock it away for her to help keep her honest. Therapist also encouraged her to be honest with doctors and maintain her recovery program that will also be [name] way to make sure she stays on track. Shannon [name] responded by saying that part of who she is, is being honest. Shannon [name] still stays focused and motivated in her recovery.   Suicidal/Homicidal: no  Plan: 1.Shannon [name] continue to work her daily recovery program.2.Shannon [name] continue with her follow up treatment.  [facility] BH PHP THERAPIST GROUP NOTE  Date: 07/18/14 Time: 1:00 PM-2:00 PM  Group Topic/Focus:  Topic of Group was Denial as [name] Defense Mechanism that was Introduced through [name] personcomedic video  Participation Level:  Active  Participation Quality:  Attentive  Affect:  Appropriate  and Irritable  Cognitive:  Appropriate  Insight: Improving  Engagement in Group:  Engaged  Modes of Intervention:  Activity, Discussion, Education and Exploration  Additional Comments: [name] comedic short video was shown that helped the group to discuss the topic of denial. Therapist explained that it is [name] defense that protects ourselves from uncomfortable feelings but that it keeps us from looking at ourselves and our behaviors truthfully. It maintains behaviors that are self-defeating but that you may not be ready to change. It often seen in addiction and through the mechanism of denial you refuse to see reality and it allows you to continue in your usage. [name] watched and discussed topic of denial actively and appropriately. She was irritated by negativity of another group member but was able to be redirectable.  It was helpful to see how denial related to her own addictive behaviors.   Suicidal/Homicidal: no  Plan: 1.Shannon [name] continue to work on attitude and behavior change.2.Shannon [name] will continue with follow up treatment.   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 Major depressive disorder, single episode, severe without psychotic features   2. Polysubstance (excluding opioids) dependence       Shannon [name] 07/18/2014

## 2014-07-21 ENCOUNTER — Other Ambulatory Visit (HOSPITAL_COMMUNITY): Payer: Self-pay

## 2014-07-22 ENCOUNTER — Other Ambulatory Visit (HOSPITAL_COMMUNITY): Payer: Self-pay

## 2014-07-23 ENCOUNTER — Other Ambulatory Visit (HOSPITAL_COMMUNITY): Payer: Self-pay

## 2014-07-24 ENCOUNTER — Other Ambulatory Visit (HOSPITAL_COMMUNITY): Payer: Self-pay

## 2014-07-25 ENCOUNTER — Other Ambulatory Visit (HOSPITAL_COMMUNITY): Payer: Self-pay

## 2014-07-28 ENCOUNTER — Other Ambulatory Visit (HOSPITAL_COMMUNITY): Payer: Self-pay

## 2014-07-29 ENCOUNTER — Other Ambulatory Visit (HOSPITAL_COMMUNITY): Payer: Self-pay

## 2014-07-29 ENCOUNTER — Encounter (HOSPITAL_COMMUNITY): Payer: Self-pay | Admitting: Licensed Clinical Social Worker

## 2014-07-30 ENCOUNTER — Other Ambulatory Visit (HOSPITAL_COMMUNITY): Payer: Self-pay

## 2014-07-31 ENCOUNTER — Other Ambulatory Visit (HOSPITAL_COMMUNITY): Payer: Self-pay

## 2014-08-01 ENCOUNTER — Other Ambulatory Visit (HOSPITAL_COMMUNITY): Payer: Self-pay

## 2014-08-04 ENCOUNTER — Other Ambulatory Visit (HOSPITAL_COMMUNITY): Payer: Self-pay

## 2014-08-05 ENCOUNTER — Other Ambulatory Visit (HOSPITAL_COMMUNITY): Payer: Self-pay

## 2014-08-06 ENCOUNTER — Other Ambulatory Visit (HOSPITAL_COMMUNITY): Payer: Self-pay

## 2014-08-07 ENCOUNTER — Other Ambulatory Visit (HOSPITAL_COMMUNITY): Payer: Self-pay

## 2014-08-08 ENCOUNTER — Other Ambulatory Visit (HOSPITAL_COMMUNITY): Payer: Self-pay

## 2014-08-11 ENCOUNTER — Other Ambulatory Visit (HOSPITAL_COMMUNITY): Payer: Self-pay

## 2014-08-25 ENCOUNTER — Ambulatory Visit (HOSPITAL_COMMUNITY): Payer: Self-pay | Admitting: Clinical

## 2014-09-16 ENCOUNTER — Telehealth (HOSPITAL_COMMUNITY): Payer: Self-pay | Admitting: Licensed Clinical Social Worker

## 2014-10-22 ENCOUNTER — Telehealth (HOSPITAL_COMMUNITY): Payer: Self-pay | Admitting: Licensed Clinical Social Worker

## 2014-11-12 NOTE — Telephone Encounter (Signed)
[id number]

## 2016-01-06 ENCOUNTER — Emergency Department
Admission: EM | Admit: 2016-01-06 | Discharge: 2016-01-06 | Disposition: A | Discharge status: Home / Self Care | Payer: BC Managed Care – PPO | Attending: Emergency Medicine | Admitting: Emergency Medicine

## 2016-01-06 ENCOUNTER — Emergency Department: Payer: BC Managed Care – PPO

## 2016-01-06 DIAGNOSIS — F1010 Alcohol abuse, uncomplicated: Secondary | ICD-10-CM | POA: Insufficient documentation

## 2016-01-06 DIAGNOSIS — Z8679 Personal history of other diseases of the circulatory system: Secondary | ICD-10-CM | POA: Insufficient documentation

## 2016-01-06 LAB — URINALYSIS, REFLEX TO MICROSCOPIC EXAM IF INDICATED
Bilirubin, UA: NEGATIVE
Glucose, UA: NEGATIVE
Ketones UA: 80
Leukocyte Esterase, UA: NEGATIVE
Nitrite, UA: NEGATIVE
Protein, UR: >=500 — AB
Specific Gravity UA: 1.024 (ref 1.001–1.035)
Urine pH: 9 — AB (ref 5.0–8.0)
Urobilinogen, UA: NEGATIVE mg/dL

## 2016-01-06 LAB — CBC AND DIFFERENTIAL
Absolute NRBC: 0 10*3/uL
Basophils Absolute Automated: 0.02 10*3/uL (ref 0.00–0.20)
Basophils Automated: 0.3 %
Eosinophils Absolute Automated: 0.01 10*3/uL (ref 0.00–0.70)
Eosinophils Automated: 0.2 %
Hematocrit: 43.7 % (ref 37.0–47.0)
Hgb: 15.3 g/dL (ref 12.0–16.0)
Immature Granulocytes Absolute: 0.02 10*3/uL
Immature Granulocytes: 0.3 %
Lymphocytes Absolute Automated: 1.21 10*3/uL (ref 0.50–4.40)
Lymphocytes Automated: 18.8 %
MCH: 31.7 pg (ref 28.0–32.0)
MCHC: 35 g/dL (ref 32.0–36.0)
MCV: 90.7 fL (ref 80.0–100.0)
MPV: 10.4 fL (ref 9.4–12.3)
Monocytes Absolute Automated: 0.22 10*3/uL (ref 0.00–1.20)
Monocytes: 3.4 %
Neutrophils Absolute: 4.94 10*3/uL (ref 1.80–8.10)
Neutrophils: 77 %
Nucleated RBC: 0 /100 WBC (ref 0.0–1.0)
Platelets: 305 10*3/uL (ref 140–400)
RBC: 4.82 10*6/uL (ref 4.20–5.40)
RDW: 12 % (ref 12–15)
WBC: 6.42 10*3/uL (ref 3.50–10.80)

## 2016-01-06 LAB — COMPREHENSIVE METABOLIC PANEL
ALT: 17 U/L (ref 0–55)
AST (SGOT): 30 U/L (ref 5–34)
Albumin/Globulin Ratio: 1.5 (ref 0.9–2.2)
Albumin: 5 g/dL (ref 3.5–5.0)
Alkaline Phosphatase: 57 U/L (ref 37–106)
Anion Gap: 15 (ref 5.0–15.0)
BUN: 9 mg/dL (ref 7.0–19.0)
Bilirubin, Total: 0.5 mg/dL (ref 0.2–1.2)
CO2: 20 mEq/L — ABNORMAL LOW (ref 22–29)
Calcium: 10.4 mg/dL (ref 8.5–10.5)
Chloride: 108 mEq/L (ref 100–111)
Creatinine: 0.9 mg/dL (ref 0.6–1.0)
Globulin: 3.3 g/dL (ref 2.0–3.6)
Glucose: 108 mg/dL — ABNORMAL HIGH (ref 70–100)
Potassium: 4.6 mEq/L (ref 3.5–5.1)
Protein, Total: 8.3 g/dL (ref 6.0–8.3)
Sodium: 143 mEq/L (ref 136–145)

## 2016-01-06 LAB — HEMOLYSIS INDEX: Hemolysis Index: 101 — ABNORMAL HIGH (ref 0–18)

## 2016-01-06 LAB — GFR: EGFR: >60

## 2016-01-06 LAB — ETHANOL: Alcohol: NOT DETECTED mg/dL

## 2016-01-06 LAB — LIPASE: Lipase: 23 U/L (ref 8–78)

## 2016-01-06 LAB — URINE BHCG POC: Urine bHCG POC: NEGATIVE

## 2016-01-06 LAB — HCG QUANTITATIVE: hCG, Quant.: <1.2

## 2016-01-06 MED ORDER — ONDANSETRON 4 MG PO TBDP
4.0000 mg | ORAL_TABLET | Freq: Four times a day (QID) | ORAL | 0 refills | Status: AC | PRN
Start: 2016-01-06 — End: ?

## 2016-01-06 MED ORDER — ONDANSETRON HCL 4 MG/2ML IJ SOLN
4.0000 mg | Freq: Once | INTRAMUSCULAR | Status: AC
Start: 2016-01-06 — End: 2016-01-06
  Administered 2016-01-06: 4 mg via INTRAVENOUS
  Filled 2016-01-06: qty 2

## 2016-01-06 MED ORDER — SODIUM CHLORIDE 0.9 % IV BOLUS
1000.0000 mL | Freq: Once | INTRAVENOUS | Status: AC
Start: 2016-01-06 — End: 2016-01-06
  Administered 2016-01-06: 1000 mL via INTRAVENOUS

## 2016-01-06 NOTE — Discharge Instructions (Signed)
Alcohol Abuse     You have been diagnosed with alcohol abuse.     This is a serious problem that can be life-threatening. Alcohol abuse can ruin your life and the lives of those who care about you.     Alcoholism is an addiction. Recent research says that the tendency to become addicted to alcohol may be genetic, which means that it can be passed from one generation to the next.     It is important to have a counselor and a family doctor who see you on a regular basis. A counselor can help you with your problems, keep a close eye on you, and follow your progress. The medical staff may give you a list of phone numbers to call if you feel that you need to talk to someone before your arranged follow-up appointment.     DO NOT DRIVE A VEHICLE UNDER THE INFLUENCE OF ALCOHOL! YOU MAY INJURE OR KILL YOURSELF OR SOMEONE ELSE IF YOU DRINK AND DRIVE. AND IT IS AGAINST THE LAW!     Your alcohol level is TOO HIGH for you to drive yourself home right now.     YOU SHOULD SEEK MEDICAL ATTENTION IMMEDIATELY, EITHER HERE OR AT THE NEAREST EMERGENCY DEPARTMENT, IF ANY OF THE FOLLOWING OCCURS:  · You can’t control your shaking or you hallucinate (see or hear things that other people do not see or hear).  · You have thoughts of harming or killing yourself.   · You vomit three times or more or you see blood in your vomit or your stool. Blood in the stool can be bright red, dark red, or black and tar-like.  · You have abdominal (belly) pain or you start to have fever (temperature higher than 100.4ºF / 38ºC).  · You do not feel safe at home or you feel you might hurt yourself or use alcohol again.  · You become worse and feel that you cannot wait until your follow-up appointment for treatment.

## 2016-01-06 NOTE — ED Notes (Signed)
Bed: BL22  Expected date:   Expected time:   Means of arrival:   Comments:  Pt in rm needs reg

## 2016-01-06 NOTE — ED Provider Notes (Signed)
EMERGENCY DEPARTMENT HISTORY AND PHYSICAL EXAM     Physician/Midlevel provider first contact with patient: 01/06/16 1253         Date: 01/06/2016  Patient Name: Shelly Peterson    History of Presenting Illness     Chief Complaint   Patient presents with   . Abdominal Pain   . Emesis   . Diarrhea       History Provided By: Patient    Chief Complaint: vomiting  Onset: 7 hours ago  Timing: intermittent  Location: GI  Quality: NBNB  Severity: moderate  Exacerbating factors: none  Alleviating factors: none  Associated Symptoms: Diffuse abdominal pain  Pertinent Negatives: fever    Additional History: Shelly Peterson is a 25 y.o. female presenting to the ED with vomiting for the past 7 hours. She consumed an excessive amount of alcohol last night. She notes diffuse abdominal pain.  There is no dysuria, hematuria, back pain, or any other associated symptoms      PCP: Stanford, Morene Rankins, MD  SPECIALISTS:    No current facility-administered medications for this encounter.      No current outpatient prescriptions on file.       Past History     Past Medical History:  Past Medical History:   Diagnosis Date   . ADHD    . Anxiety    . Depression    . Hypertension        Past Surgical History:  Past Surgical History:   Procedure Laterality Date   . TYMPANOSTOMY, PE TUBES BILATERAL         Family History:  History reviewed. No pertinent family history.    Social History:  Social History   Substance Use Topics   . Smoking status: Never Smoker   . Smokeless tobacco: Never Used   . Alcohol use Yes       Allergies:  No Known Allergies    Review of Systems     Review of Systems   Constitutional: Negative for fever.   Respiratory: Negative for chest tightness and shortness of breath.    Cardiovascular: Negative for chest pain.   Gastrointestinal: Positive for abdominal pain, nausea and vomiting.   Genitourinary: Negative for flank pain.   Musculoskeletal: Negative for back pain.   Skin: Negative for rash.   Neurological: Negative for  weakness and headaches.   Psychiatric/Behavioral: Negative for confusion.         Physical Exam   BP 131/88   Pulse 64   Temp 97.6 F (36.4 C) (Oral)   Resp 20   Ht 5\' 9"  (1.753 m)   Wt 63.5 kg   LMP 01/03/2016   SpO2 100%   BMI 20.67 kg/m     Physical Exam   Constitutional: She is oriented to person, place, and time. She appears well-developed and well-nourished. She appears distressed (actively vomiting).   Cardiovascular: Normal rate and intact distal pulses.    Pulmonary/Chest: Effort normal. No respiratory distress.   Abdominal: Soft. Bowel sounds are normal. She exhibits no distension and no mass. There is tenderness (mild, diffuse). There is no rebound and no guarding.   Neurological: She is alert and oriented to person, place, and time.   Skin: Skin is warm and dry.   Psychiatric: She has a normal mood and affect.       Diagnostic Study Results     Labs -     Results     Procedure Component Value Units Date/Time  UA, Reflex to Microscopic (pts  3 + yrs) [[id number]] Collected:  01/06/16 1401    Specimen:  Urine Updated:  01/06/16 1418    Urine BHCG POC [[id number]] Collected:  01/06/16 1410     Updated:  01/06/16 1416     Urine bHCG POC Negative    Beta HCG Quant Serum [[id number]] Collected:  01/06/16 1305     Updated:  01/06/16 1415     hCG, Quant. <1.2    Comprehensive metabolic panel [[id number]]  (Abnormal) Collected:  01/06/16 1305    Specimen:  Blood Updated:  01/06/16 1336     Glucose 108 (H) mg/dL      BUN 9.0 mg/dL      Creatinine 0.9 mg/dL      Sodium 629 mEq/L      Potassium 4.6 mEq/L      Chloride 108 mEq/L      CO2 20 (L) mEq/L      Calcium 10.4 mg/dL      Protein, Total 8.3 g/dL      Albumin 5.0 g/dL      AST (SGOT) 30 U/L      ALT 17 U/L      Alkaline Phosphatase 57 U/L      Bilirubin, Total 0.5 mg/dL      Globulin 3.3 g/dL      Albumin/Globulin Ratio 1.5     Anion Gap 15.0    Lipase [[id number]] Collected:  01/06/16 1305    Specimen:  Blood Updated:  01/06/16 1336     Lipase 23 U/L      Hemolysis index [[id number]]  (Abnormal) Collected:  01/06/16 1305     Updated:  01/06/16 1336     Hemolysis Index 101 (H)    GFR [[id number]] Collected:  01/06/16 1305     Updated:  01/06/16 1336     EGFR >60.0    Alcohol (Ethanol) Level [[id number]] Collected:  01/06/16 1305    Specimen:  Blood Updated:  01/06/16 1336     Alcohol None Detected mg/dL     CBC with differential [[id number]] Collected:  01/06/16 1305    Specimen:  Blood from Blood Updated:  01/06/16 1321     WBC 6.42 x10 3/uL      Hgb 15.3 g/dL      Hematocrit 41.6 %      Platelets 305 x10 3/uL      RBC 4.82 x10 6/uL      MCV 90.7 fL      MCH 31.7 pg      MCHC 35.0 g/dL      RDW 12 %      MPV 10.4 fL      Neutrophils 77.0 %      Lymphocytes Automated 18.8 %      Monocytes 3.4 %      Eosinophils Automated 0.2 %      Basophils Automated 0.3 %      Immature Granulocyte 0.3 %      Nucleated RBC 0.0 /100 WBC      Neutrophils Absolute 4.94 x10 3/uL      Abs Lymph Automated 1.21 x10 3/uL      Abs Mono Automated 0.22 x10 3/uL      Abs Eos Automated 0.01 x10 3/uL      Absolute Baso Automated 0.02 x10 3/uL      Absolute Immature Granulocyte 0.02 x10 3/uL      Absolute NRBC 0.00 x10 3/uL  Radiologic Studies -   Radiology Results (24 Hour)     ** No results found for the last 24 hours. **      .    Medical Decision Making   I am the first provider for this patient.    I reviewed the vital signs, available nursing notes, past medical history, past surgical history, family history and social history.    Vital Signs-Reviewed the patient's vital signs.     Patient Vitals for the past 12 hrs:   BP Temp Pulse Resp   01/06/16 1249 131/88 97.6 F (36.4 C) 64 20       Pulse Oximetry Analysis - Normal 100% on RA        Old Medical Records: Nursing notes.     ED Course:     1350. Patient's symptoms are significantly improved. She has mild cramping of abdomen and nausea has resolved. Reviewed lab results including Lipase. Awaiting beta HcG prior to disposition.      1447. HcG level was negative. Patient is stable and ready for discharge. Counseled on diagnosis, f/u plans, medication use, and signs and symptoms when to return to ED.       Provider Notes:   25 year old woman presenting for nausea, vomiting after heavily drinking last night.  Diffuse abdominal pain, which after Zofran, has resolved.  Overall feeling well after IV fluids and antiemetics in the ED.  We will discharge to follow-up with primary care as needed, discussed return precautions    Diagnosis     Clinical Impression:   1. Alcohol abuse        Treatment Plan:   ED Disposition     ED Disposition Condition Date/Time Comment    Discharge  Wed Jan 06, 2016  2:25 PM Marylene Dunlap discharge to home/self care.    Condition at disposition: Stable            _______________________________      Attestations: This note is prepared by Leonia Reader, acting as scribe for Dr. Geannie Risen, MD.    Dr. Geannie Risen, MD - The scribe's documentation has been prepared under my direction and personally reviewed by me in its entirety.  I confirm that the note above accurately reflects all work, treatment, procedures, and medical decision making performed by me.    _______________________________         Thea Gist, MD  01/06/16 [phone]

## 2016-01-06 NOTE — ED Triage Notes (Signed)
abd pain with vomiting and diarrhea since 0600, was out drinking last night.

## 2017-01-16 IMAGING — US US ABDOMEN COMPLETE
1 series · 14 of 25 positions shown · non-contrast
Comparison: None.

CLINICAL DATA: Right upper quadrant pain for 2 years, now worse.

EXAM:
ULTRASOUND ABDOMEN COMPLETE

[Series 1: us abdomen complete · 0.14mm/px · 14 of 73 slices shown]
[im 1/73]
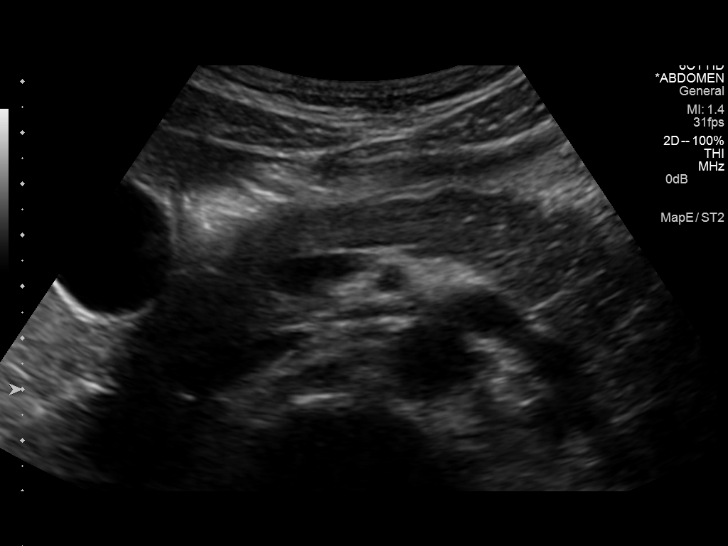
[im 7/73]
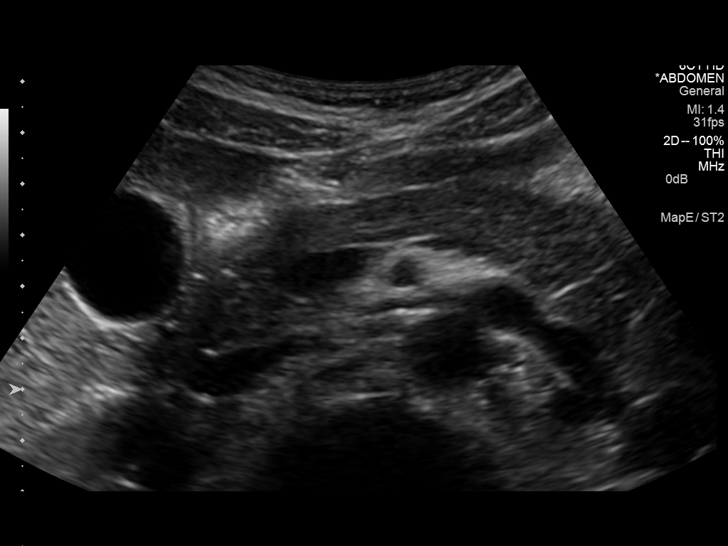
[im 13/73]
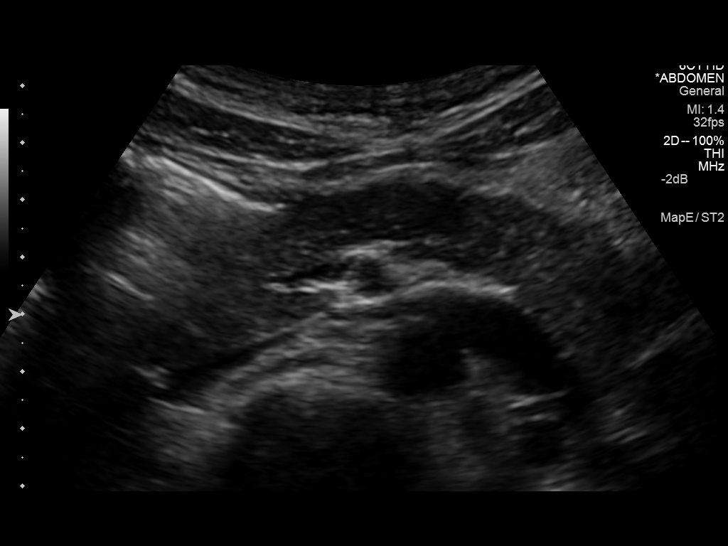
[im 19/73]
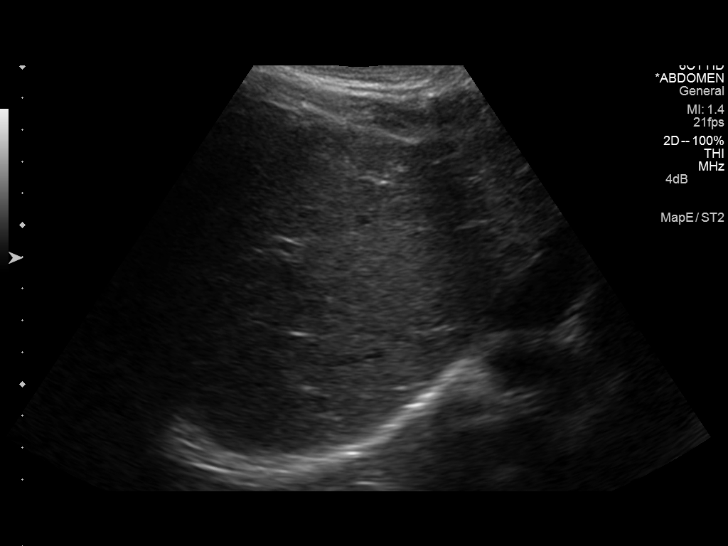
[im 25/73]
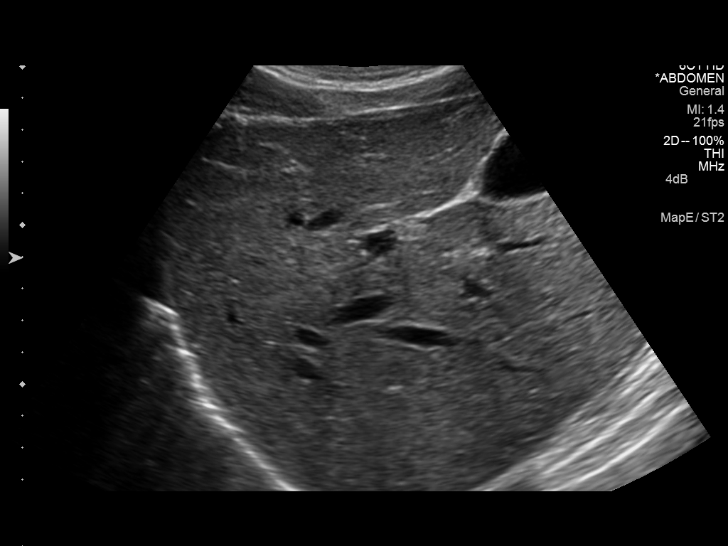
[im 28/73]
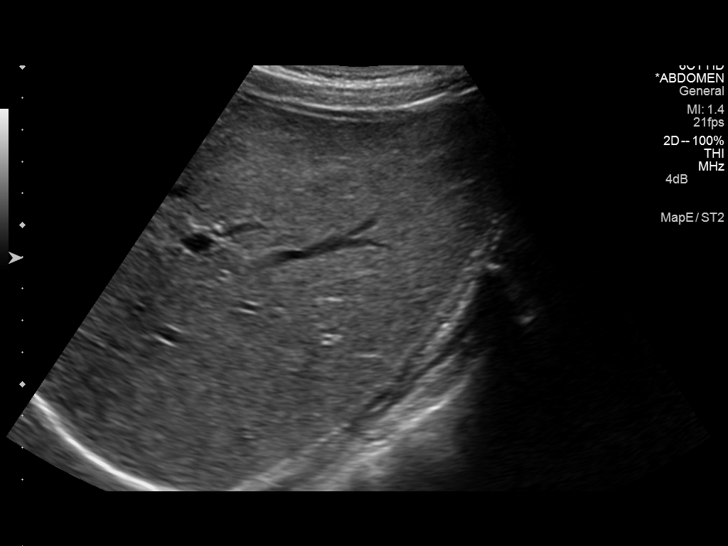
[im 34/73]
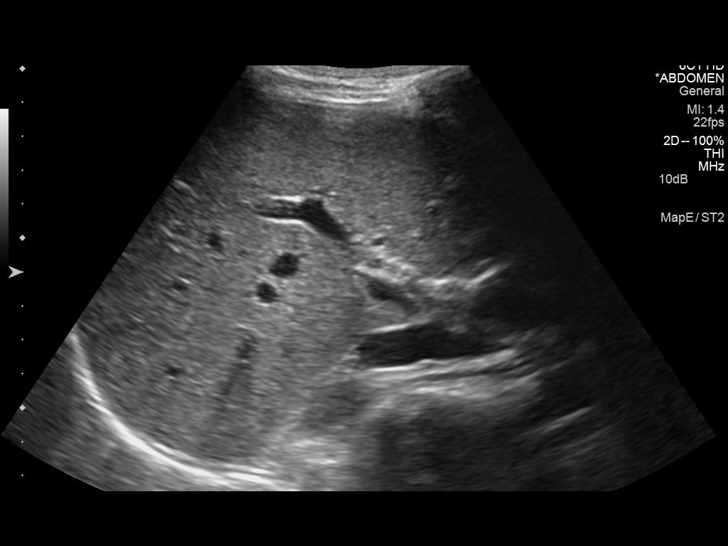
[im 40/73]
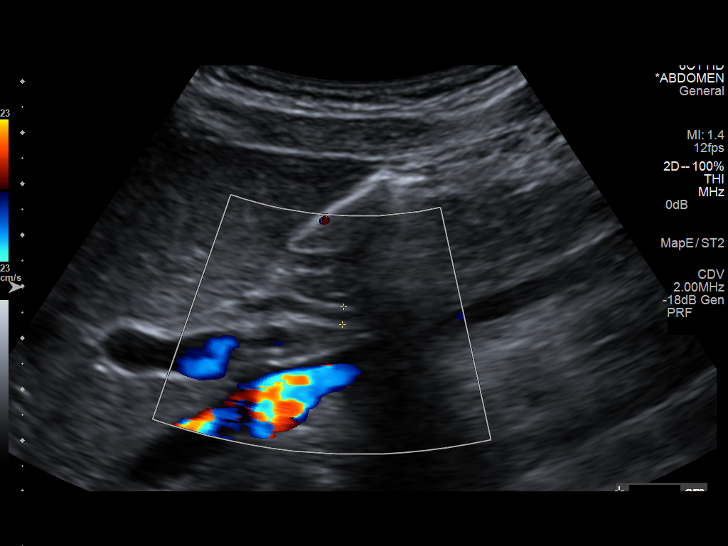
[im 46/73]
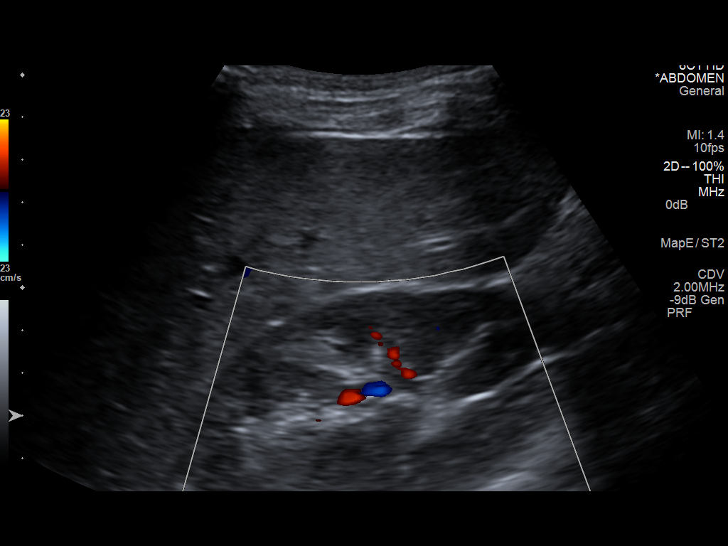
[im 49/73]
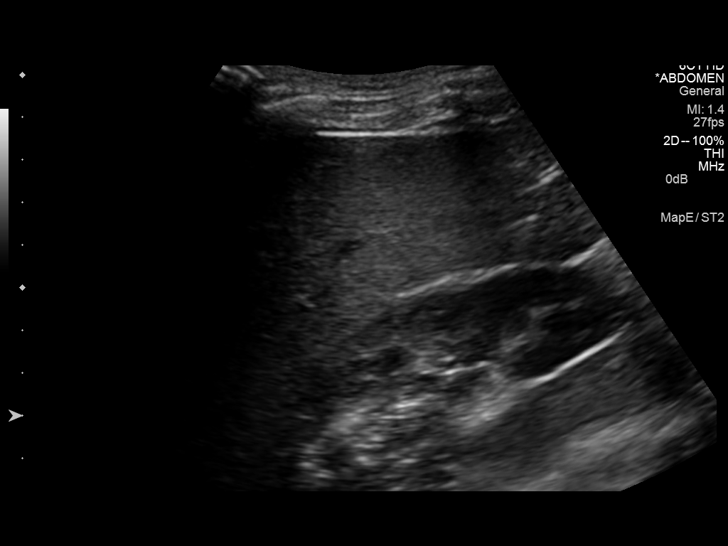
[im 55/73]
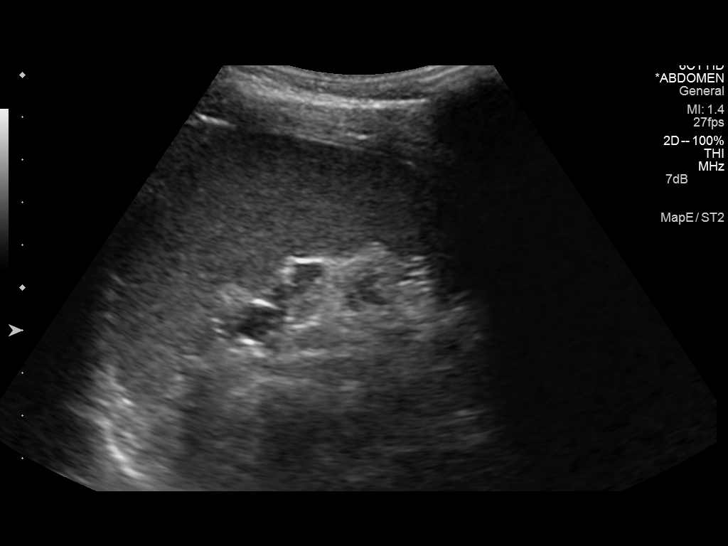
[im 61/73]
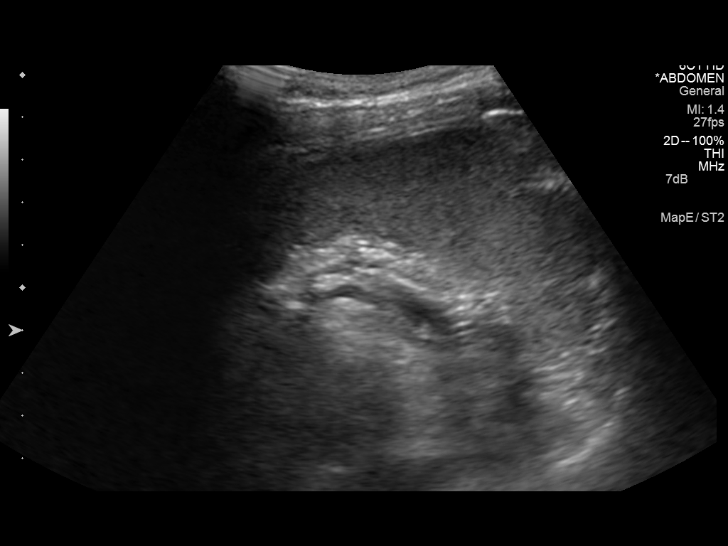
[im 67/73]
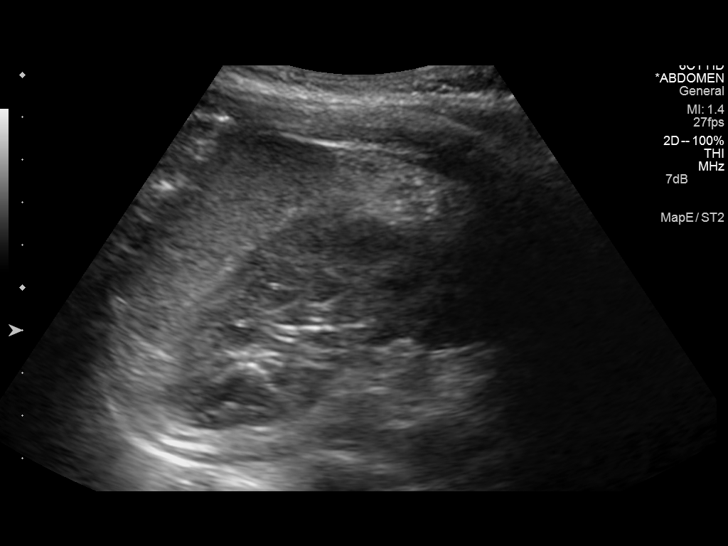
[im 73/73]
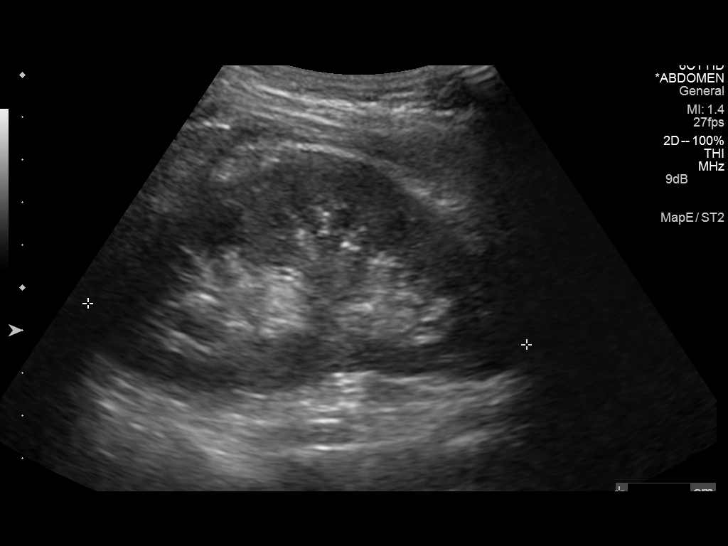

[14 of 25 positions shown; findings below may reference images not displayed]

FINDINGS: Gallbladder: No gallstones or wall thickening visualized. No
sonographic Murphy sign noted.

Common bile duct: Diameter: 3.5 mm

Liver: No focal lesion identified. Within normal limits in
parenchymal echogenicity.

IVC: No abnormality visualized.

Pancreas: Visualized portion unremarkable.

Spleen: Size and appearance within normal limits.

Right Kidney: Length: 9.9 cm. Echogenicity within normal limits. No
mass or hydronephrosis visualized.

Left Kidney: Length: 10.5 cm. Echogenicity within normal limits. No
mass or hydronephrosis visualized.

Abdominal aorta: No aneurysm visualized.

Other findings: None.
IMPRESSION: Normal abdominal ultrasound.
